# Patient Record
Sex: Female | Born: 1965 | Race: Black or African American | Hispanic: No | Marital: Married | Smoking: Never smoker
Health system: Southern US, Community
[De-identification: ages and names within clinical notes are randomized; demographics above are authoritative.]

## PROBLEM LIST (undated history)

## (undated) DIAGNOSIS — E119 Type 2 diabetes mellitus without complications: Secondary | ICD-10-CM

## (undated) DIAGNOSIS — I10 Essential (primary) hypertension: Secondary | ICD-10-CM

## (undated) DIAGNOSIS — K5792 Diverticulitis of intestine, part unspecified, without perforation or abscess without bleeding: Secondary | ICD-10-CM

## (undated) DIAGNOSIS — K219 Gastro-esophageal reflux disease without esophagitis: Secondary | ICD-10-CM

## (undated) DIAGNOSIS — R935 Abnormal findings on diagnostic imaging of other abdominal regions, including retroperitoneum: Secondary | ICD-10-CM

## (undated) DIAGNOSIS — K5790 Diverticulosis of intestine, part unspecified, without perforation or abscess without bleeding: Secondary | ICD-10-CM

## (undated) HISTORY — DX: Type 2 diabetes mellitus without complications: E11.9

## (undated) HISTORY — PX: ABDOMINAL HYSTERECTOMY: SHX81

---

## 2014-10-26 ENCOUNTER — Observation Stay (HOSPITAL_COMMUNITY)
Admission: EM | Admit: 2014-10-26 | Discharge: 2014-10-27 | Disposition: A | Discharge status: Home / Self Care | Payer: Managed Care, Other (non HMO) | Attending: Emergency Medicine | Admitting: Emergency Medicine

## 2014-10-26 ENCOUNTER — Encounter (HOSPITAL_COMMUNITY): Payer: Self-pay | Admitting: Emergency Medicine

## 2014-10-26 ENCOUNTER — Emergency Department (HOSPITAL_COMMUNITY): Payer: Managed Care, Other (non HMO)

## 2014-10-26 DIAGNOSIS — I10 Essential (primary) hypertension: Secondary | ICD-10-CM | POA: Diagnosis not present

## 2014-10-26 DIAGNOSIS — R1084 Generalized abdominal pain: Secondary | ICD-10-CM | POA: Diagnosis not present

## 2014-10-26 DIAGNOSIS — K5792 Diverticulitis of intestine, part unspecified, without perforation or abscess without bleeding: Secondary | ICD-10-CM | POA: Diagnosis present

## 2014-10-26 DIAGNOSIS — R112 Nausea with vomiting, unspecified: Secondary | ICD-10-CM

## 2014-10-26 DIAGNOSIS — R1033 Periumbilical pain: Secondary | ICD-10-CM | POA: Insufficient documentation

## 2014-10-26 DIAGNOSIS — K219 Gastro-esophageal reflux disease without esophagitis: Secondary | ICD-10-CM | POA: Diagnosis not present

## 2014-10-26 DIAGNOSIS — R109 Unspecified abdominal pain: Secondary | ICD-10-CM | POA: Diagnosis present

## 2014-10-26 DIAGNOSIS — R935 Abnormal findings on diagnostic imaging of other abdominal regions, including retroperitoneum: Secondary | ICD-10-CM | POA: Diagnosis present

## 2014-10-26 HISTORY — DX: Abnormal findings on diagnostic imaging of other abdominal regions, including retroperitoneum: R93.5

## 2014-10-26 HISTORY — DX: Gastro-esophageal reflux disease without esophagitis: K21.9

## 2014-10-26 HISTORY — DX: Diverticulitis of intestine, part unspecified, without perforation or abscess without bleeding: K57.92

## 2014-10-26 HISTORY — DX: Essential (primary) hypertension: I10

## 2014-10-26 HISTORY — DX: Diverticulosis of intestine, part unspecified, without perforation or abscess without bleeding: K57.90

## 2014-10-26 LAB — COMPREHENSIVE METABOLIC PANEL
ALBUMIN: 4 g/dL (ref 3.5–5.2)
ALK PHOS: 46 U/L (ref 39–117)
ALT: 23 U/L (ref 0–35)
ANION GAP: 8 (ref 5–15)
AST: 25 U/L (ref 0–37)
BILIRUBIN TOTAL: 1.3 mg/dL — AB (ref 0.3–1.2)
BUN: 12 mg/dL (ref 6–23)
CALCIUM: 9.5 mg/dL (ref 8.4–10.5)
CO2: 29 mmol/L (ref 19–32)
Chloride: 101 mmol/L (ref 96–112)
Creatinine, Ser: 0.89 mg/dL (ref 0.50–1.10)
GFR calc Af Amer: 87 mL/min — ABNORMAL LOW (ref >90)
GFR calc non Af Amer: 75 mL/min — ABNORMAL LOW (ref >90)
GLUCOSE: 114 mg/dL — AB (ref 70–99)
POTASSIUM: 3.1 mmol/L — AB (ref 3.5–5.1)
Sodium: 138 mmol/L (ref 135–145)
Total Protein: 7.2 g/dL (ref 6.0–8.3)

## 2014-10-26 LAB — URINALYSIS, ROUTINE W REFLEX MICROSCOPIC
Bilirubin Urine: NEGATIVE
GLUCOSE, UA: NEGATIVE mg/dL
Ketones, ur: NEGATIVE mg/dL
Nitrite: NEGATIVE
Protein, ur: NEGATIVE mg/dL
SPECIFIC GRAVITY, URINE: 1.01 (ref 1.005–1.030)
UROBILINOGEN UA: 0.2 mg/dL (ref 0.0–1.0)
pH: 7 (ref 5.0–8.0)

## 2014-10-26 LAB — URINE MICROSCOPIC-ADD ON

## 2014-10-26 LAB — CBC WITH DIFFERENTIAL/PLATELET
Basophils Absolute: 0 10*3/uL (ref 0.0–0.1)
Basophils Relative: 0 % (ref 0–1)
EOS ABS: 0 10*3/uL (ref 0.0–0.7)
Eosinophils Relative: 0 % (ref 0–5)
HCT: 36.8 % (ref 36.0–46.0)
HEMOGLOBIN: 12.3 g/dL (ref 12.0–15.0)
LYMPHS ABS: 2.9 10*3/uL (ref 0.7–4.0)
Lymphocytes Relative: 16 % (ref 12–46)
MCH: 31.5 pg (ref 26.0–34.0)
MCHC: 33.4 g/dL (ref 30.0–36.0)
MCV: 94.4 fL (ref 78.0–100.0)
MONOS PCT: 4 % (ref 3–12)
Monocytes Absolute: 0.7 10*3/uL (ref 0.1–1.0)
NEUTROS ABS: 14.4 10*3/uL — AB (ref 1.7–7.7)
Neutrophils Relative %: 80 % — ABNORMAL HIGH (ref 43–77)
PLATELETS: 275 10*3/uL (ref 150–400)
RBC: 3.9 MIL/uL (ref 3.87–5.11)
RDW: 12.8 % (ref 11.5–15.5)
WBC: 18 10*3/uL — ABNORMAL HIGH (ref 4.0–10.5)

## 2014-10-26 LAB — LIPASE, BLOOD: Lipase: 17 U/L (ref 11–59)

## 2014-10-26 LAB — MAGNESIUM: Magnesium: 1.9 mg/dL (ref 1.5–2.5)

## 2014-10-26 MED ORDER — IOHEXOL 300 MG/ML  SOLN
100.0000 mL | Freq: Once | INTRAMUSCULAR | Status: AC | PRN
  Administered 2014-10-26: 100 mL via INTRAVENOUS

## 2014-10-26 MED ORDER — HEPARIN SODIUM (PORCINE) 5000 UNIT/ML IJ SOLN
5000.0000 [IU] | Freq: Three times a day (TID) | INTRAMUSCULAR | Status: DC
Start: 2014-10-26 — End: 2014-10-27
  Administered 2014-10-27 (×2): 5000 [IU] via SUBCUTANEOUS
  Filled 2014-10-26 (×2): qty 1

## 2014-10-26 MED ORDER — MORPHINE SULFATE 4 MG/ML IJ SOLN
4.0000 mg | Freq: Once | INTRAMUSCULAR | Status: AC
  Administered 2014-10-26: 4 mg via INTRAVENOUS
  Filled 2014-10-26: qty 1

## 2014-10-26 MED ORDER — METRONIDAZOLE IN NACL 5-0.79 MG/ML-% IV SOLN
500.0000 mg | Freq: Once | INTRAVENOUS | Status: AC
  Administered 2014-10-26: 500 mg via INTRAVENOUS
  Filled 2014-10-26: qty 100

## 2014-10-26 MED ORDER — POTASSIUM CHLORIDE IN NACL 20-0.9 MEQ/L-% IV SOLN
INTRAVENOUS | Status: DC
  Administered 2014-10-26 – 2014-10-27 (×2): 1 mL via INTRAVENOUS

## 2014-10-26 MED ORDER — POTASSIUM CHLORIDE CRYS ER 20 MEQ PO TBCR
40.0000 meq | EXTENDED_RELEASE_TABLET | Freq: Once | ORAL | Status: AC
  Administered 2014-10-26: 40 meq via ORAL
  Filled 2014-10-26: qty 2

## 2014-10-26 MED ORDER — MORPHINE SULFATE 2 MG/ML IJ SOLN: 2.0000 mg | INTRAMUSCULAR | Status: DC | PRN

## 2014-10-26 MED ORDER — CIPROFLOXACIN IN D5W 400 MG/200ML IV SOLN
400.0000 mg | Freq: Two times a day (BID) | INTRAVENOUS | Status: DC
  Administered 2014-10-27: 400 mg via INTRAVENOUS
  Filled 2014-10-26: qty 200

## 2014-10-26 MED ORDER — ONDANSETRON HCL 4 MG/2ML IJ SOLN: 4.0000 mg | Freq: Four times a day (QID) | INTRAMUSCULAR | Status: DC | PRN

## 2014-10-26 MED ORDER — SODIUM CHLORIDE 0.9 % IV BOLUS (SEPSIS)
1000.0000 mL | Freq: Once | INTRAVENOUS | Status: AC
  Administered 2014-10-26: 1000 mL via INTRAVENOUS

## 2014-10-26 MED ORDER — IOHEXOL 300 MG/ML  SOLN
25.0000 mL | Freq: Once | INTRAMUSCULAR | Status: AC | PRN
  Administered 2014-10-26: 25 mL via ORAL

## 2014-10-26 MED ORDER — CIPROFLOXACIN IN D5W 400 MG/200ML IV SOLN
400.0000 mg | Freq: Once | INTRAVENOUS | Status: AC
Start: 2014-10-26 — End: 2014-10-26
  Administered 2014-10-26: 400 mg via INTRAVENOUS
  Filled 2014-10-26: qty 200

## 2014-10-26 MED ORDER — ONDANSETRON HCL 4 MG PO TABS: 4.0000 mg | ORAL_TABLET | Freq: Four times a day (QID) | ORAL | Status: DC | PRN

## 2014-10-26 MED ORDER — METRONIDAZOLE IN NACL 5-0.79 MG/ML-% IV SOLN
500.0000 mg | Freq: Three times a day (TID) | INTRAVENOUS | Status: DC
  Administered 2014-10-27 (×2): 500 mg via INTRAVENOUS
  Filled 2014-10-26 (×2): qty 100

## 2014-10-26 MED ORDER — ONDANSETRON HCL 4 MG/2ML IJ SOLN
4.0000 mg | Freq: Once | INTRAMUSCULAR | Status: AC
  Administered 2014-10-26: 4 mg via INTRAVENOUS
  Filled 2014-10-26: qty 2

## 2014-10-26 NOTE — ED Provider Notes (Signed)
This chart was scribed for [place], DO by Peyton Bottoms, ED Scribe. This patient was seen in room APA12/APA12 and the patient's care was started at 1:36 PM.  TIME SEEN: 1:37 PM   CHIEF COMPLAINT: Abdominal Pain  HPI: Beth Freeman is a 49 y.o. female who presents to the Emergency Department complaining of moderate periumbilical abdominal pain with associated nausea, and vomiting that began last night after patient ate dinner last night. She states that her last normal bowel movement was earlier today. She denies associated diarrhea, dysuria, hematuria, vaginal bleeding, vaginal discharge. She has had a hysterectomy. She denies recent travel or sick contacts.  ROS: See HPI Constitutional: no fever  Eyes: no drainage  ENT: no runny nose   Cardiovascular:  no chest pain  Resp: no SOB  GI: nausea; vomiting; periumbilical abdominal pain; no diarrhea GU: no dysuria; no hematuria; no vaginal bleeding; no vaginal discharge Integumentary: no rash  Allergy: no hives  Musculoskeletal: no leg swelling  Neurological: no slurred speech ROS otherwise negative  PAST MEDICAL HISTORY/PAST SURGICAL HISTORY:  Past Medical History  Diagnosis Date  . GERD (gastroesophageal reflux disease)   . Hypertension     MEDICATIONS:  Prior to Admission medications   Not on File    ALLERGIES:  No Known Allergies  SOCIAL HISTORY:  History  Substance Use Topics  . Smoking status: Never Smoker   . Smokeless tobacco: Not on file  . Alcohol Use: No    FAMILY HISTORY: No family history on file.  EXAM: Triage Vitals: BP 137/65 mmHg  Pulse 99  Temp(Src) 98.3 F (36.8 C) (Oral)  Resp 18  Ht 5\' 4"  (1.626 m)  Wt 180 lb (81.647 kg)  BMI 30.88 kg/m2  SpO2 100%  CONSTITUTIONAL: Alert and oriented and responds appropriately to questions. Well-appearing; well-nourished HEAD: Normocephalic EYES: Conjunctivae clear, PERRL ENT: normal nose; no rhinorrhea; moist mucous membranes; pharynx without  lesions noted NECK: Supple, no meningismus, no LAD  CARD: RRR; S1 and S2 appreciated; no murmurs, no clicks, no rubs, no gallops RESP: Normal chest excursion without splinting or tachypnea; breath sounds clear and equal bilaterally; no wheezes, no rhonchi, no rales,  ABD/GI: Normal bowel sounds; diffuse abdominal tenderness with guarding, no rebound, no peritoneal signs BACK:  The back appears normal and is non-tender to palpation, there is no CVA tenderness EXT: Normal ROM in all joints; non-tender to palpation; no edema; normal capillary refill; no cyanosis    SKIN: Normal color for age and race; warm NEURO: Moves all extremities equally PSYCH: The patient's mood and manner are appropriate. Grooming and personal hygiene are appropriate.  MEDICAL DECISION MAKING: Patient here with abdominal pain, vomiting. She is diffusely tender with voluntary guarding on exam. Labs show leukocytosis with left shift. Potassium slightly low at 3.1. Will replace.  Urine shows trace hemoglobin, leukocytes and bacteria but also squamous cells. Patient does not have urinary symptoms. I do not feel this needs to be treated at this time. Culture is pending. We'll obtain CT of her abdomen and pelvis for further evaluation. Will treat with IV fluids, morphine, Zofran.  ED PROGRESS: CT scan pending. Signed out to Dr. Roderic Palau to follow up on imaging.    I personally performed the services described in this documentation, which was scribed in my presence. The recorded information has been reviewed and is accurate.   [place], DO 10/26/14 [phone]

## 2014-10-26 NOTE — H&P (Signed)
Hospitalist Admission History and Physical  Patient name: Beth Freeman Medical record number: [id number] Date of birth: [birth year] Age: 49 y.o. Gender: female  Primary Care Provider: [name], MD  Chief Complaint: diverticulitis   History of Present Illness:This is a 49 y.o. year old female with significant past medical history of HTN, diverticulosis presenting with divericulitis. Pt reports lower abd pain, nausea, vomiting over the past 2 days. Several episodes of NBNB vomiting.no diarrhea. No fevers. Pt reports eating whole pack of nuts yesterday. Reports hx/o diverticulitis in the past. Knows to avoid nuts.  Presents to the ER afebrile, hemodynamically stable. WBC 18. K 3.1. CT shows Suspected mild diverticulitis just distal to the splenic flexure of the colon. No evidence of bowel obstruction or abscess. ? Indeterminate R adnexal lesion.    Assessment and Plan: Beth Freeman is a 49 y.o. year old female presenting with diverticulitis    Active Problems:   Diverticulitis   1- Diverticulitis  -IV cipro and flagyl  -NPO  -antiemetics -pain control   2- Hypokalemia -replete  -mag level  -hold diuretic-follow  3- HTN -BP stable  -hold BP meds   4-? Adnexal lesion  -follow -may need inpt vs. outpt imaging   FEN/GI: NPO.  Prophylaxis: sub q heparin  Disposition: pending further evaluation  Code Status:Full Code    Patient Active Problem List   Diagnosis Date Noted  . Diverticulitis 10/26/2014   Past Medical History: Past Medical History  Diagnosis Date  . GERD (gastroesophageal reflux disease)   . Hypertension     Past Surgical History: Past Surgical History  Procedure Laterality Date  . Abdominal hysterectomy      Social History: History   Social History  . Marital Status: Married    Spouse Name: N/A  . Number of Children: N/A  . Years of Education: N/A   Social History Main Topics  . Smoking status: Never Smoker   . Smokeless tobacco:  Not on file  . Alcohol Use: No  . Drug Use: No  . Sexual Activity: Not on file   Other Topics Concern  . None   Social History Narrative  . None    Family History: No family history on file.  Allergies: No Known Allergies  Current Facility-Administered Medications  Medication Dose Route Frequency Provider Last Rate Last Dose  . 0.9 % NaCl with KCl 20 mEq/ L  infusion   Intravenous Continuous Shanda Howells, MD      . ciprofloxacin (CIPRO) IVPB 400 mg  400 mg Intravenous Once Maudry Diego, MD      . ciprofloxacin (CIPRO) IVPB 400 mg  400 mg Intravenous Q12H Shanda Howells, MD      . heparin injection 5,000 Units  5,000 Units Subcutaneous 3 times per day Shanda Howells, MD      . metroNIDAZOLE (FLAGYL) IVPB 500 mg  500 mg Intravenous Once Maudry Diego, MD 100 mL/hr at 10/26/14 1707 500 mg at 10/26/14 1707  . metroNIDAZOLE (FLAGYL) IVPB 500 mg  500 mg Intravenous Q8H Shanda Howells, MD      . morphine 2 MG/ML injection 2-4 mg  2-4 mg Intravenous Q3H PRN Shanda Howells, MD      . ondansetron [facility]) tablet 4 mg  4 mg Oral Q6H PRN Shanda Howells, MD       Or  . ondansetron [facility]) injection 4 mg  4 mg Intravenous Q6H PRN Shanda Howells, MD       Current Outpatient Prescriptions  Medication Sig  Dispense Refill  . losartan-hydrochlorothiazide (HYZAAR) 100-25 MG per tablet Take 1 tablet by mouth daily.     Review Of Systems: 12 point ROS negative except as noted above in HPI.  Physical Exam: Filed Vitals:   10/26/14 1700  BP:   Pulse: 83  Temp:   Resp:     General: alert and cooperative HEENT: PERRLA and extra ocular movement intact Heart: S1, S2 normal, no murmur, rub or gallop, regular rate and rhythm Lungs: clear to auscultation, no wheezes or rales and unlabored breathing Abdomen: + bowel sounds, + generalized abd pain  Extremities: extremities normal, atraumatic, no cyanosis or edema Skin:no rashes Neurology: normal without focal findings  Labs and Imaging: Lab  Results  Component Value Date/Time   NA 138 10/26/2014 01:59 PM   K 3.1* 10/26/2014 01:59 PM   CL 101 10/26/2014 01:59 PM   CO2 29 10/26/2014 01:59 PM   BUN 12 10/26/2014 01:59 PM   CREATININE 0.89 10/26/2014 01:59 PM   GLUCOSE 114* 10/26/2014 01:59 PM   Lab Results  Component Value Date   WBC 18.0* 10/26/2014   HGB 12.3 10/26/2014   HCT 36.8 10/26/2014   MCV 94.4 10/26/2014   PLT 275 10/26/2014    Ct Abdomen Pelvis W Contrast  10/26/2014   CLINICAL DATA:  Periumbilical pain with nausea and vomiting since last night. History of hypertension, hysterectomy and gastroesophageal reflux disease. Initial encounter.  EXAM: CT ABDOMEN AND PELVIS WITH CONTRAST  TECHNIQUE: Multidetector CT imaging of the abdomen and pelvis was performed using the standard protocol following bolus administration of intravenous contrast.  CONTRAST:  135mL OMNIPAQUE IOHEXOL 300 MG/ML SOLN, 49mL OMNIPAQUE IOHEXOL 300 MG/ML SOLN  COMPARISON:  None.  FINDINGS: Images through the upper abdomen are degraded by motion despite repeating some of these images.  Lower chest: Mild dependent atelectasis at both lung bases. No significant pleural or pericardial effusion. Small hiatal hernia.  Hepatobiliary: The liver is normal in density without focal abnormality. No evidence of gallstones, gallbladder wall thickening or biliary dilatation.  Pancreas: Unremarkable. No pancreatic ductal dilatation or surrounding inflammatory changes.  Spleen: The spleen is small without focal abnormality or surrounding inflammatory change.  Adrenals/Urinary Tract: Both adrenal glands appear normal.The kidneys appear normal without evidence of urinary tract calculus, suspicious lesion or hydronephrosis. No bladder abnormalities are seen.  Stomach/Bowel: Diverticular changes throughout the distal colon. There is suspected mild wall thickening of the proximal descending colon with mild surrounding inflammatory change. There is no focal extraluminal fluid  collection. There is no evidence of small bowel distension or wall thickening.The appendix appears normal.  Vascular/Lymphatic: There are no enlarged abdominal or pelvic lymph nodes. No significant vascular findings are present.  Reproductive: Status post partial hysterectomy. There is a 4.6 mm right adnexal mass on image 68 which measures soft tissue density. Within the left adnexa, there is a 3.3 cm soft tissue structure on image 67 which is probably a normal ovary.  Other: No evidence of abdominal wall mass or hernia.  Musculoskeletal: No acute or significant osseous findings.  IMPRESSION: 1. Suspected mild diverticulitis just distal to the splenic flexure of the colon. No evidence of bowel obstruction or abscess. 2. No evidence of appendicitis. 3. Indeterminate right adnexal lesion status post apparent partial hysterectomy. This could reflect a hemorrhagic ovarian cyst. Consider ultrasound correlation or follow-up. 4. Bibasilar atelectasis.   Electronically Signed   By: Richardean Sale M.D.   On: 10/26/2014 16:44  Shanda Howells MD  Pager: [phone]

## 2014-10-26 NOTE — ED Notes (Signed)
Assisted pt to restroom  

## 2014-10-26 NOTE — ED Notes (Signed)
PT stated she starting having periumbilical abdominal pain with n/v last night. Pt stated vomiting x6. PT denies any diarrhea and last BM today.

## 2014-10-27 ENCOUNTER — Encounter (HOSPITAL_COMMUNITY): Payer: Self-pay | Admitting: Internal Medicine

## 2014-10-27 DIAGNOSIS — I10 Essential (primary) hypertension: Secondary | ICD-10-CM

## 2014-10-27 DIAGNOSIS — K219 Gastro-esophageal reflux disease without esophagitis: Secondary | ICD-10-CM

## 2014-10-27 DIAGNOSIS — K5792 Diverticulitis of intestine, part unspecified, without perforation or abscess without bleeding: Secondary | ICD-10-CM

## 2014-10-27 DIAGNOSIS — R109 Unspecified abdominal pain: Secondary | ICD-10-CM

## 2014-10-27 DIAGNOSIS — R935 Abnormal findings on diagnostic imaging of other abdominal regions, including retroperitoneum: Secondary | ICD-10-CM | POA: Diagnosis present

## 2014-10-27 LAB — CBC WITH DIFFERENTIAL/PLATELET
BASOS PCT: 0 % (ref 0–1)
Basophils Absolute: 0 10*3/uL (ref 0.0–0.1)
Eosinophils Absolute: 0 10*3/uL (ref 0.0–0.7)
Eosinophils Relative: 0 % (ref 0–5)
HEMATOCRIT: 32 % — AB (ref 36.0–46.0)
Hemoglobin: 10.5 g/dL — ABNORMAL LOW (ref 12.0–15.0)
LYMPHS PCT: 30 % (ref 12–46)
Lymphs Abs: 3.4 10*3/uL (ref 0.7–4.0)
MCH: 31.3 pg (ref 26.0–34.0)
MCHC: 32.8 g/dL (ref 30.0–36.0)
MCV: 95.2 fL (ref 78.0–100.0)
Monocytes Absolute: 0.7 10*3/uL (ref 0.1–1.0)
Monocytes Relative: 6 % (ref 3–12)
Neutro Abs: 7.1 10*3/uL (ref 1.7–7.7)
Neutrophils Relative %: 64 % (ref 43–77)
PLATELETS: 254 10*3/uL (ref 150–400)
RBC: 3.36 MIL/uL — ABNORMAL LOW (ref 3.87–5.11)
RDW: 12.8 % (ref 11.5–15.5)
WBC: 11.3 10*3/uL — AB (ref 4.0–10.5)

## 2014-10-27 LAB — COMPREHENSIVE METABOLIC PANEL
ALBUMIN: 3 g/dL — AB (ref 3.5–5.2)
ALK PHOS: 46 U/L (ref 39–117)
ALT: 19 U/L (ref 0–35)
AST: 20 U/L (ref 0–37)
Anion gap: 4 — ABNORMAL LOW (ref 5–15)
BUN: 9 mg/dL (ref 6–23)
CO2: 28 mmol/L (ref 19–32)
Calcium: 8.5 mg/dL (ref 8.4–10.5)
Chloride: 108 mmol/L (ref 96–112)
Creatinine, Ser: 0.86 mg/dL (ref 0.50–1.10)
GFR calc Af Amer: >90 mL/min (ref >90)
GFR calc non Af Amer: 78 mL/min — ABNORMAL LOW (ref >90)
GLUCOSE: 119 mg/dL — AB (ref 70–99)
Potassium: 3.7 mmol/L (ref 3.5–5.1)
Sodium: 140 mmol/L (ref 135–145)
Total Bilirubin: 1.4 mg/dL — ABNORMAL HIGH (ref 0.3–1.2)
Total Protein: 5.8 g/dL — ABNORMAL LOW (ref 6.0–8.3)

## 2014-10-27 LAB — URINE CULTURE: Colony Count: 60000

## 2014-10-27 MED ORDER — CIPROFLOXACIN HCL 500 MG PO TABS: 500.0000 mg | ORAL_TABLET | Freq: Two times a day (BID) | ORAL | Status: DC

## 2014-10-27 MED ORDER — HYDROCODONE-ACETAMINOPHEN 5-325 MG PO TABS: 1.0000 | ORAL_TABLET | Freq: Four times a day (QID) | ORAL | Status: DC | PRN

## 2014-10-27 MED ORDER — METRONIDAZOLE 500 MG PO TABS: 500.0000 mg | ORAL_TABLET | Freq: Three times a day (TID) | ORAL | Status: DC

## 2014-10-27 NOTE — Progress Notes (Signed)
Discharge instructions and prescriptions given, verbalized understanding, out in stable condition ambulatory with staff. 

## 2014-10-27 NOTE — Discharge Summary (Signed)
Physician Discharge Summary  Beth Freeman ATF:[id number] DOB: 1966/06/16 DOA: 10/26/2014  PCP: [name], MD  Admit date: 10/26/2014 Discharge date: 10/27/2014  Time spent: 40 minutes  Recommendations for Outpatient Follow-up:  1. PCP 1-2 weeks for evaluation of diverticulitis. Follow up on ? Adnexal lesion   Discharge Diagnoses:  Principal Problem:   Diverticulitis Active Problems:   Hypertension   GERD (gastroesophageal reflux disease)   Abdominal pain   Discharge Condition: stable  Diet recommendation: soft advance as tolerated  Filed Weights   10/26/14 1323 10/26/14 1852  Weight: 81.647 kg (180 lb) 79.6 kg (175 lb 7.8 oz)    History of present illness:  This is a 49 y.o. year old female with significant past medical history of HTN, diverticulosis presented to ED on 10/26/14 with divericulitis. Pt reported lower abd pain, nausea, vomiting over 2 days. Several episodes of NBNB vomiting.no diarrhea. No fevers. Pt reported eating whole pack of nuts 10/25/14. Reported hx/o diverticulitis in the past. Knows to avoid nuts.  Presented to the ER afebrile, hemodynamically stable. WBC 18. K 3.1. CT showed Suspected mild diverticulitis just distal to the splenic flexure of the colon. No evidence of bowel obstruction or abscess. ? Indeterminate R adnexal lesion  Hospital Course:  1- Diverticulitis  - amitted and provided with IV cipro and flagyl and bowel rest. Abdominal pain resolved. No n/v. Tolerating soft bland diet at discharge. Instructed to advance as tolerated. Will discharge with cipro and flagyl    2- Hypokalemia -repleted and resolved at discharge. Magnesium within the limits of normal. Home HCTZ held  3- HTN -BP remained stable.   4-? Adnexal lesion  -follow OP. Will likely need further imaging.    Procedures:  none  Consultations:  none  Discharge Exam: Filed Vitals:   10/27/14 0635  BP: 126/72  Pulse: 82  Temp: 98.4 F (36.9 C)  Resp: 16     General: ambulating in room gait steady Cardiovascular: RRR no MGR No LE edema Respiratory: normal effort BS clear bilaterally no wheeze Abdomen: non-distended +BS only mild diffuse tenderness  Discharge Instructions    Current Discharge Medication List    START taking these medications   Details  ciprofloxacin (CIPRO) 500 MG tablet Take 1 tablet (500 mg total) by mouth 2 (two) times daily. Qty: 14 tablet, Refills: 0    metroNIDAZOLE (FLAGYL) 500 MG tablet Take 1 tablet (500 mg total) by mouth 3 (three) times daily. Qty: 21 tablet, Refills: 0      CONTINUE these medications which have NOT CHANGED   Details  losartan-hydrochlorothiazide (HYZAAR) 100-25 MG per tablet Take 1 tablet by mouth daily.       No Known Allergies    The results of significant diagnostics from this hospitalization (including imaging, microbiology, ancillary and laboratory) are listed below for reference.    Significant Diagnostic Studies: Ct Abdomen Pelvis W Contrast  10/26/2014   CLINICAL DATA:  Periumbilical pain with nausea and vomiting since last night. History of hypertension, hysterectomy and gastroesophageal reflux disease. Initial encounter.  EXAM: CT ABDOMEN AND PELVIS WITH CONTRAST  TECHNIQUE: Multidetector CT imaging of the abdomen and pelvis was performed using the standard protocol following bolus administration of intravenous contrast.  CONTRAST:  132mL OMNIPAQUE IOHEXOL 300 MG/ML SOLN, 41mL OMNIPAQUE IOHEXOL 300 MG/ML SOLN  COMPARISON:  None.  FINDINGS: Images through the upper abdomen are degraded by motion despite repeating some of these images.  Lower chest: Mild dependent atelectasis at both lung bases. No significant pleural  or pericardial effusion. Small hiatal hernia.  Hepatobiliary: The liver is normal in density without focal abnormality. No evidence of gallstones, gallbladder wall thickening or biliary dilatation.  Pancreas: Unremarkable. No pancreatic ductal dilatation or  surrounding inflammatory changes.  Spleen: The spleen is small without focal abnormality or surrounding inflammatory change.  Adrenals/Urinary Tract: Both adrenal glands appear normal.The kidneys appear normal without evidence of urinary tract calculus, suspicious lesion or hydronephrosis. No bladder abnormalities are seen.  Stomach/Bowel: Diverticular changes throughout the distal colon. There is suspected mild wall thickening of the proximal descending colon with mild surrounding inflammatory change. There is no focal extraluminal fluid collection. There is no evidence of small bowel distension or wall thickening.The appendix appears normal.  Vascular/Lymphatic: There are no enlarged abdominal or pelvic lymph nodes. No significant vascular findings are present.  Reproductive: Status post partial hysterectomy. There is a 4.6 mm right adnexal mass on image 68 which measures soft tissue density. Within the left adnexa, there is a 3.3 cm soft tissue structure on image 67 which is probably a normal ovary.  Other: No evidence of abdominal wall mass or hernia.  Musculoskeletal: No acute or significant osseous findings.  IMPRESSION: 1. Suspected mild diverticulitis just distal to the splenic flexure of the colon. No evidence of bowel obstruction or abscess. 2. No evidence of appendicitis. 3. Indeterminate right adnexal lesion status post apparent partial hysterectomy. This could reflect a hemorrhagic ovarian cyst. Consider ultrasound correlation or follow-up. 4. Bibasilar atelectasis.   Electronically Signed   By: Richardean Sale M.D.   On: 10/26/2014 16:44    Microbiology: No results found for this or any previous visit (from the past 240 hour(s)).   Labs: Basic Metabolic Panel:  Recent Labs Lab 10/26/14 1359 10/27/14 0445  NA 138 140  K 3.1* 3.7  CL 101 108  CO2 29 28  GLUCOSE 114* 119*  BUN 12 9  CREATININE 0.89 0.86  CALCIUM 9.5 8.5  MG 1.9  --    Liver Function Tests:  Recent Labs Lab  10/26/14 1359 10/27/14 0445  AST 25 20  ALT 23 19  ALKPHOS 46 46  BILITOT 1.3* 1.4*  PROT 7.2 5.8*  ALBUMIN 4.0 3.0*    Recent Labs Lab 10/26/14 1359  LIPASE 17   No results for input(s): AMMONIA in the last 168 hours. CBC:  Recent Labs Lab 10/26/14 1359 10/27/14 0445  WBC 18.0* 11.3*  NEUTROABS 14.4* 7.1  HGB 12.3 10.5*  HCT 36.8 32.0*  MCV 94.4 95.2  PLT 275 254   Cardiac Enzymes: No results for input(s): CKTOTAL, CKMB, CKMBINDEX, TROPONINI in the last 168 hours. BNP: BNP (last 3 results) No results for input(s): BNP in the last 8760 hours.  ProBNP (last 3 results) No results for input(s): PROBNP in the last 8760 hours.  CBG: No results for input(s): GLUCAP in the last 168 hours.     SignedRadene Gunning  [facility] 10/27/2014, 12:33 PM

## 2014-10-27 NOTE — Progress Notes (Signed)
UR completed 

## 2014-10-27 NOTE — Care Management Note (Signed)
    Page 1 of 1   10/27/2014     12:43:56 PM CARE MANAGEMENT NOTE 10/27/2014  Patient:  Beth Freeman, Beth Freeman   Account Number:  [id number]  Date Initiated:  10/27/2014  Documentation initiated by:  Jolene Provost  Subjective/Objective Assessment:   Pt is from home, independent at baseline. Pt admitted with diverticulitis. Pt discharging home with self care. No CM needs identified.     Action/Plan:   Anticipated DC Date:  10/27/2014   Anticipated DC Plan:  [place]  CM consult      Choice offered to / List presented to:             Status of service:  Completed, signed off Medicare Important Message given?   (If response is "NO", the following Medicare IM given date fields will be blank) Date Medicare IM given:   Medicare IM given by:   Date Additional Medicare IM given:   Additional Medicare IM given by:    Discharge Disposition:  HOME/SELF CARE  Per UR Regulation:    If discussed at Long Length of Stay Meetings, dates discussed:    Comments:  10/27/2014 [place], RN, MSN, CM

## 2016-08-12 IMAGING — CT CT ABD-PELV W/ CM
2 of 6 series · 15 of 46 positions shown, 17 images · IV contrast (omnipaque)
Comparison: None.

CLINICAL DATA: Periumbilical pain with nausea and vomiting since
last night. History of hypertension, hysterectomy and
gastroesophageal reflux disease. Initial encounter.

EXAM:
CT ABDOMEN AND PELVIS WITH CONTRAST
TECHNIQUE: Multidetector CT imaging of the abdomen and pelvis was performed
using the standard protocol following bolus administration of
intravenous contrast.
CONTRAST:  100mL OMNIPAQUE IOHEXOL 300 MG/ML SOLN, 25mL OMNIPAQUE
IOHEXOL 300 MG/ML SOLN

[Series 2: abd_pel_with 5.0 b40f · axial · 0.77mm/px · z∈[-407,-2]mm · 12 of 93 slices shown, 14 images]
[im 6/93  soft-tissue]
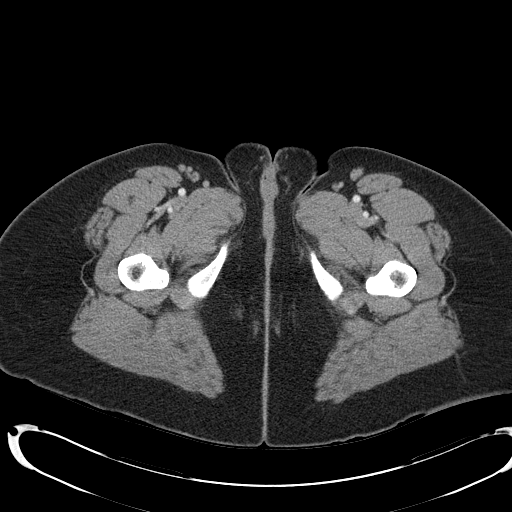
[im 6/93  bone]
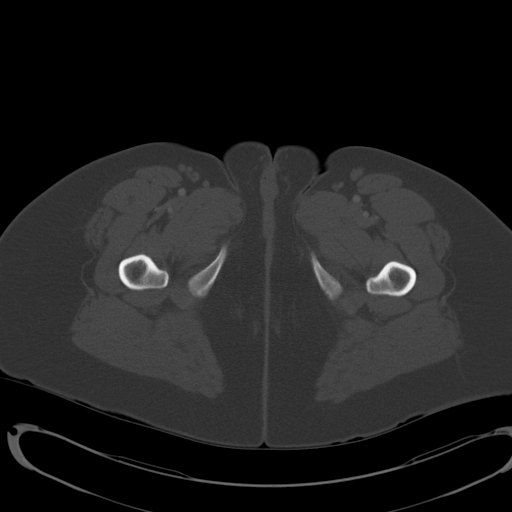
[im 12/93  soft-tissue]
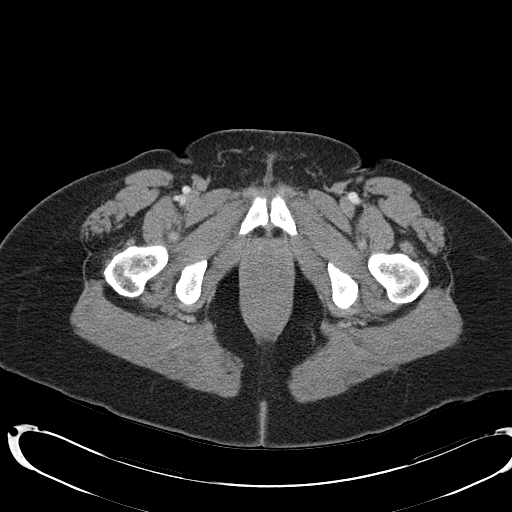
[im 24/93  soft-tissue]
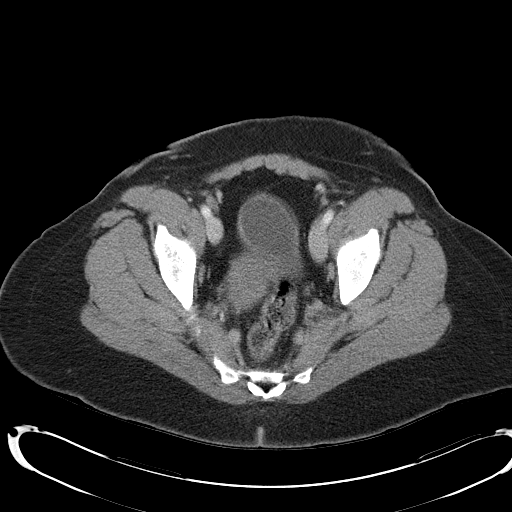
[im 29/93  soft-tissue]
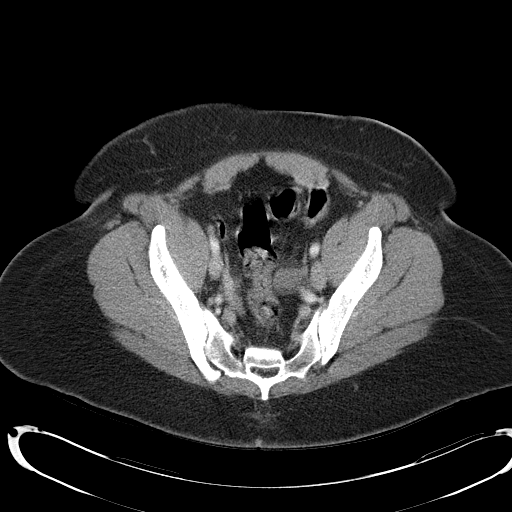
[im 35/93  soft-tissue]
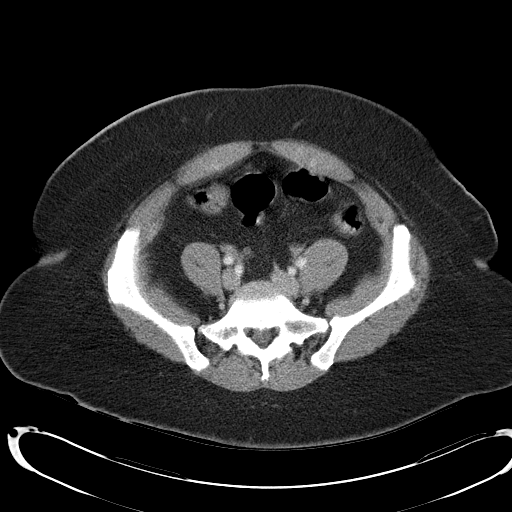
[im 41/93  soft-tissue]
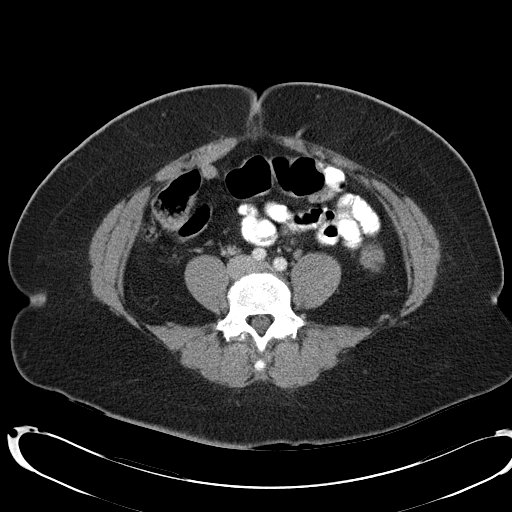
[im 52/93  soft-tissue]
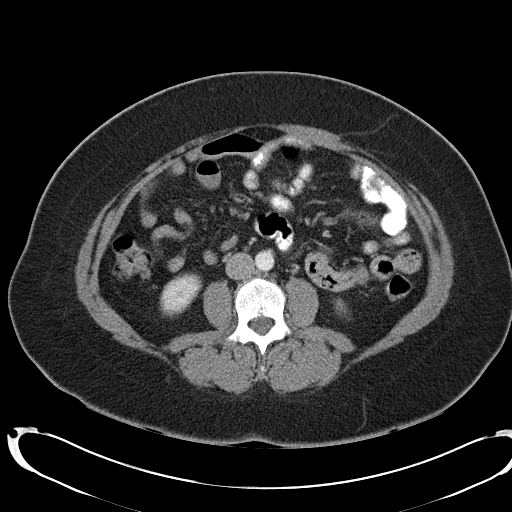
[im 58/93  soft-tissue]
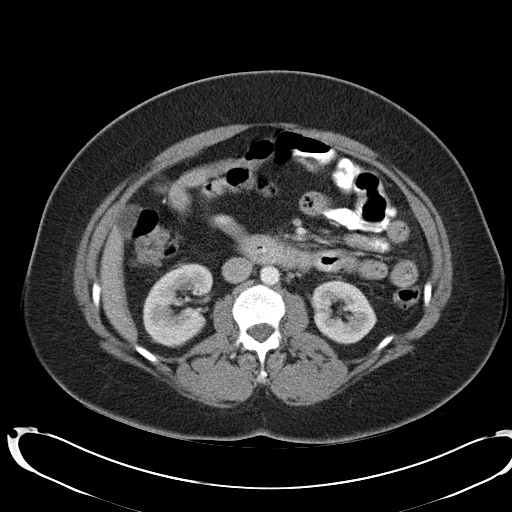
[im 64/93  soft-tissue]
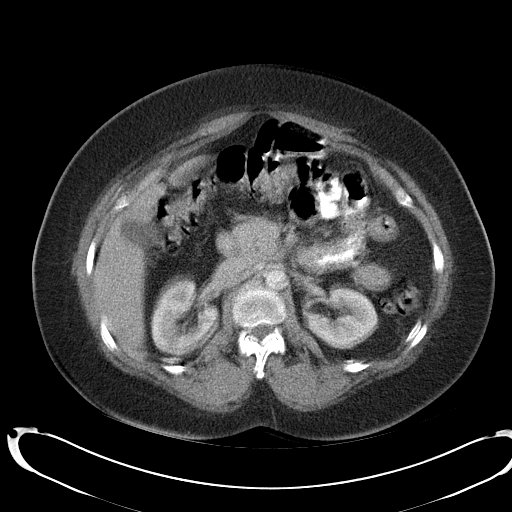
[im 64/93  bone]
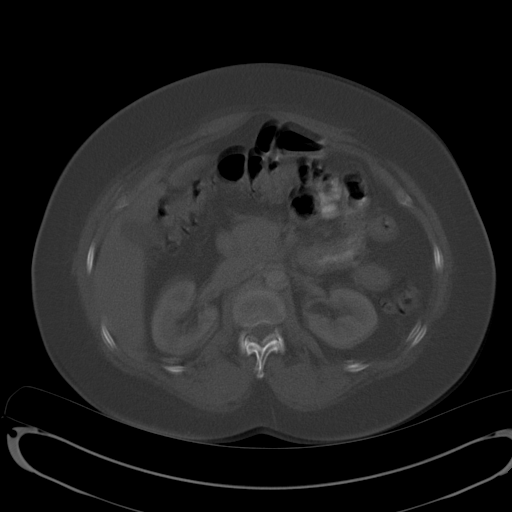
[im 70/93  soft-tissue]
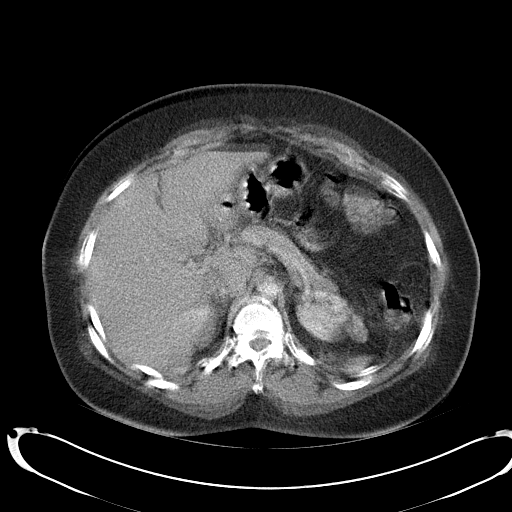
[im 81/93  soft-tissue]
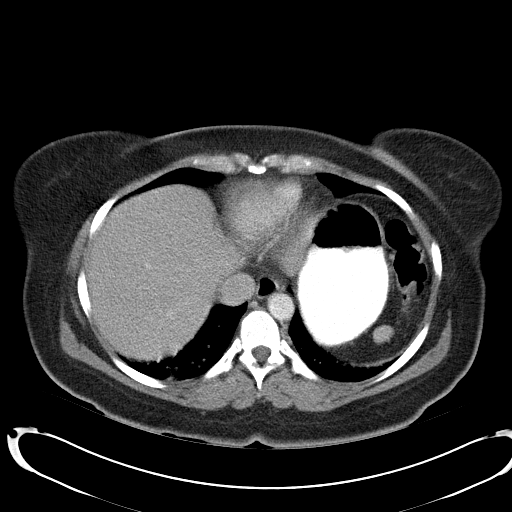
[im 87/93  soft-tissue]
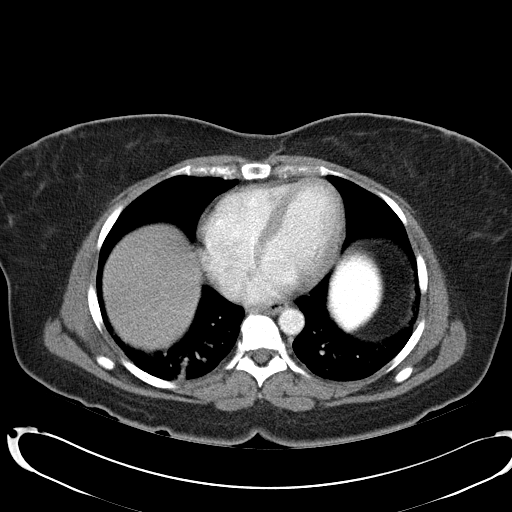

[Series 4: abd_pel_with 3.0 spo cor · coronal · 0.67mm/px · 3 of 94 slices shown]
[im 32/94  soft-tissue]
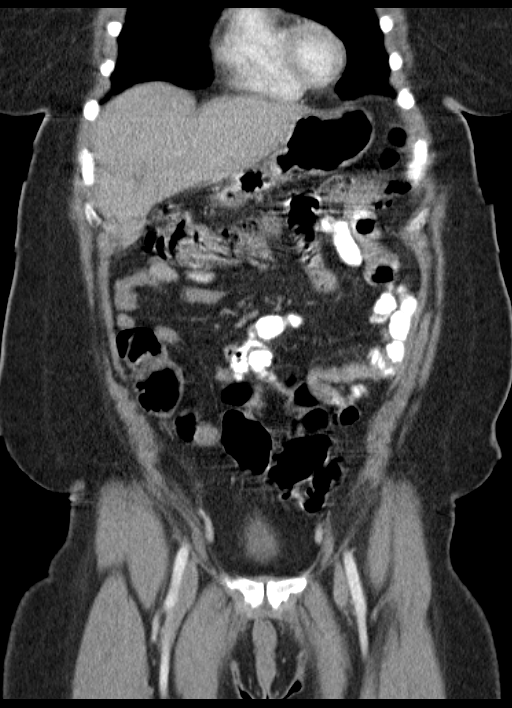
[im 42/94  soft-tissue]
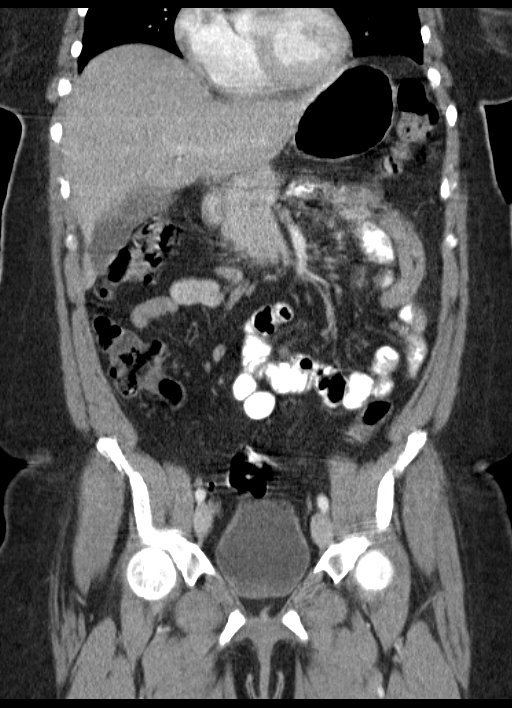
[im 52/94  soft-tissue]
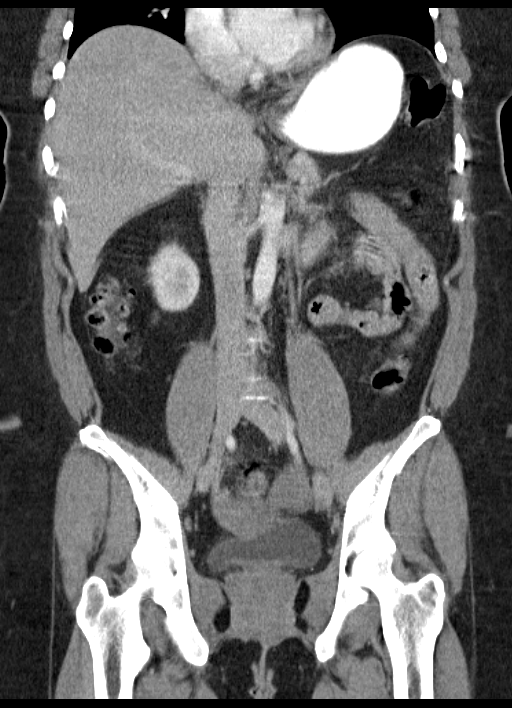

[15 of 46 positions shown; findings below may reference images not displayed]

FINDINGS: Images through the upper abdomen are degraded by motion despite
repeating some of these images.

Lower chest: Mild dependent atelectasis at both lung bases. No
significant pleural or pericardial effusion. Small hiatal hernia.

Hepatobiliary: The liver is normal in density without focal
abnormality. No evidence of gallstones, gallbladder wall thickening
or biliary dilatation.

Pancreas: Unremarkable. No pancreatic ductal dilatation or
surrounding inflammatory changes.

Spleen: The spleen is small without focal abnormality or surrounding
inflammatory change.

Adrenals/Urinary Tract: Both adrenal glands appear normal.The
kidneys appear normal without evidence of urinary tract calculus,
suspicious lesion or hydronephrosis. No bladder abnormalities are
seen.

Stomach/Bowel: Diverticular changes throughout the distal colon.
There is suspected mild wall thickening of the proximal descending
colon with mild surrounding inflammatory change. There is no focal
extraluminal fluid collection. There is no evidence of small bowel
distension or wall thickening.The appendix appears normal.

Vascular/Lymphatic: There are no enlarged abdominal or pelvic lymph
nodes. No significant vascular findings are present.

Reproductive: Status post partial hysterectomy. There is a 4.6 mm
right adnexal mass on image 68 which measures soft tissue density.
Within the left adnexa, there is a 3.3 cm soft tissue structure on
image 67 which is probably a normal ovary.

Other: No evidence of abdominal wall mass or hernia.

Musculoskeletal: No acute or significant osseous findings.
IMPRESSION: 1. Suspected mild diverticulitis just distal to the splenic flexure
of the colon. No evidence of bowel obstruction or abscess.
2. No evidence of appendicitis.
3. Indeterminate right adnexal lesion status post apparent partial
hysterectomy. This could reflect a hemorrhagic ovarian cyst.
Consider ultrasound correlation or follow-up.
4. Bibasilar atelectasis.

## 2016-08-22 HISTORY — PX: COLONOSCOPY: SHX174

## 2020-11-30 ENCOUNTER — Telehealth: Payer: Self-pay

## 2020-11-30 NOTE — Telephone Encounter (Signed)
Returned patient's VM. To see about scheduling an appointment. LVM.

## 2020-12-21 DIAGNOSIS — D3912 Neoplasm of uncertain behavior of left ovary: Secondary | ICD-10-CM | POA: Insufficient documentation

## 2020-12-21 DIAGNOSIS — C562 Malignant neoplasm of left ovary: Secondary | ICD-10-CM | POA: Insufficient documentation

## 2020-12-21 HISTORY — PX: OTHER SURGICAL HISTORY: SHX169

## 2021-01-07 ENCOUNTER — Ambulatory Visit
Admission: RE | Admit: 2021-01-07 | Discharge: 2021-01-07 | Disposition: A | Discharge status: Home / Self Care | Payer: Self-pay | Source: Ambulatory Visit | Attending: Gynecologic Oncology | Admitting: Gynecologic Oncology

## 2021-01-07 ENCOUNTER — Encounter: Payer: Self-pay | Admitting: Gynecologic Oncology

## 2021-01-07 ENCOUNTER — Other Ambulatory Visit: Payer: Self-pay

## 2021-01-07 ENCOUNTER — Telehealth: Payer: Self-pay | Admitting: *Deleted

## 2021-01-07 DIAGNOSIS — R109 Unspecified abdominal pain: Secondary | ICD-10-CM

## 2021-01-07 NOTE — Telephone Encounter (Signed)
Spoke with the patient and scheduled a new patient appt for 5/23 with Dr Denman George at 9:45 am; patient given an arrival time of 9:15 am. Patient given the address and phone number for the clinic, along with the policy for mask and visitors

## 2021-01-11 ENCOUNTER — Other Ambulatory Visit: Payer: Self-pay

## 2021-01-11 ENCOUNTER — Encounter: Payer: Self-pay | Admitting: Gynecologic Oncology

## 2021-01-11 ENCOUNTER — Inpatient Hospital Stay: Payer: Commercial Managed Care - PPO | Attending: Gynecologic Oncology | Admitting: Gynecologic Oncology

## 2021-01-11 VITALS — BP 177/88 | HR 100 | Temp 98.1°F | Resp 20 | Ht 64.0 in | Wt 150.0 lb

## 2021-01-11 DIAGNOSIS — E1165 Type 2 diabetes mellitus with hyperglycemia: Secondary | ICD-10-CM

## 2021-01-11 DIAGNOSIS — K219 Gastro-esophageal reflux disease without esophagitis: Secondary | ICD-10-CM | POA: Diagnosis not present

## 2021-01-11 DIAGNOSIS — I10 Essential (primary) hypertension: Secondary | ICD-10-CM | POA: Insufficient documentation

## 2021-01-11 DIAGNOSIS — Z90721 Acquired absence of ovaries, unilateral: Secondary | ICD-10-CM | POA: Insufficient documentation

## 2021-01-11 DIAGNOSIS — E119 Type 2 diabetes mellitus without complications: Secondary | ICD-10-CM | POA: Diagnosis not present

## 2021-01-11 DIAGNOSIS — Z90710 Acquired absence of both cervix and uterus: Secondary | ICD-10-CM | POA: Insufficient documentation

## 2021-01-11 DIAGNOSIS — D3912 Neoplasm of uncertain behavior of left ovary: Secondary | ICD-10-CM | POA: Diagnosis present

## 2021-01-11 DIAGNOSIS — C562 Malignant neoplasm of left ovary: Secondary | ICD-10-CM

## 2021-01-11 NOTE — Patient Instructions (Signed)
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 This involves key hole surgery with removal of the right ovary, fatty tissue biopsies and washings of fluid from the abdomen.  This will be performed in June.  Dr Serita Grit office will contact you with the schedule for surgery and have you return to see Joylene John, her nurse practitioner, for a preoperative visit.  Dr Serita Grit office can be contacted at [phone].

## 2021-01-11 NOTE — H&P (View-Only) (Signed)
Consult Note: Gyn-Onc  Consult was requested by Dr. Addison Bailey for the evaluation of Beth Freeman 55 y.o. female  CC:  Chief Complaint  Patient presents with  . Granulosa cell carcinoma of ovary, left [facility])    Assessment/Plan:  [name]  is a 55 y.o.  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  I discussed with the patient that the options were: 1/ proceed with second staging surgery (ideally at approximately 6 weeks postop from her first surgery to minimize risk of complications). This would involve removal of the right tube and ovary, omentectomy, peritoneal biopsies and washings. This would provide definitive staging information to guide adjuvant therapy Rx. I explained that lymph node involvement occurs in only approximately 1% of clinical stage I granulosa cell tumor, therefore lymphadenectomy is not recommended.  2/ clinically stage her tumor based on initial operative findings. I would not recommend adjuvant therapy based on her pathology report.  I recommended the first option (definitive staging) with the caveat that this would most likely yield negative staging information and would be unlikely change therapy recommendations. However, is the most definitive, and I have some concerns given that a cystectomy and not an oophorectomy was performed on the right. I explained that reoperation is associated with risk of complications from adhesions, as well as other surgical complications  bleeding, infection, damage to internal organs (such as bladder,ureters, bowels), blood clot, reoperation and rehospitalization. Additionally healing and recovery would be protracted given that this is a second surgery within 2 months.  The patient is in agreement with proceeding with surgery.  She will have a preoperative visit prior to the date.  She will have inhibin B and AMH levels drawn preop.    HPI: [name] is a 55 year old woman who was  seen in consultation at the request of Dr Addison Bailey for evaluation of a left ovarian granulosa cell tumor found incidentally at the time of an LSO (and right ovarian cystectomy) for bilateral ovarian cysts on 12/21/20.  The patient has a remote history of a hysterectomy for benign indications approximately 15 years prior to diagnosis.  She reported having an episode of severe dyspareunia on 12/06/2020.  This sent the patient to the emergency room in [facility] where a CT scan of the abdomen and pelvis was performed which demonstrated bilateral heterogeneously enhancing adnexal masses likely ovarian in origin.  The right-sided mass measured 7.6 cm and the left-sided mass measured 6.8 cm.  She sought evaluation by her gynecologist who performed a Ca1 125 which was normal at 9.2, normal CEA at 2.3, normal HD4.  They proceeded with planned surgery with either BSO or USO and ovarian cystectomy.  This surgical procedure took place on 12/21/2020 with Dr. Addison Bailey and began as a laparoscopy.  Intraoperative findings were significant for large bilateral ovarian cysts.  A decision was made to convert to laparotomy via her Pfannenstiel incision. A left salpingo-oophorectomy and right ovarian cystectomy was performed. The operative report does not mention evidence of extraovarian disease.  Based on the operative report the ovarian masses were removed intact that this is not clearly stipulated in the note.  Final pathology from the procedure revealed a left ovary containing adult granulosa cell tumor that was 8.5 cm there were no lymph nodes, washings or peritoneal biopsies for staging purposes.  The surface of the ovary did not demonstrate obvious surface involvement however the left ovary was ruptured focally.  The right ovarian cyst  that was removed revealed inflammation and granulation tissue without granulosa cell tumor.  The right ovary cyst was ruptured at the time was received by pathology.  Based on the apparent  rupture of the left ovarian granulosa cell tumor, and empiric stage of stage Ic was assigned.  Her medical history is most significant for hypertension, type II DM.  Her surgical history is most significant for abdominal hysterectomy for benign indications.  Her family cancer history is unremarkable.  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 She lives with her husband.   Current Meds:  Outpatient Encounter Medications as of 01/11/2021  Medication Sig  . chlorthalidone (HYGROTON) 25 MG tablet Take 25 mg by mouth daily.  . Ferrous Sulfate (IRON PO) Take 65 mg by mouth daily.  . Potassium 99 MG TABS Take 1 tablet by mouth daily.  . [DISCONTINUED] metFORMIN (GLUCOPHAGE) 500 MG tablet Take 500 mg by mouth daily.  . Cholecalciferol (VITAMIN D3 PO) Take 75 mcg by mouth daily. (Patient not taking: Reported on 01/07/2021)  . Multiple Vitamin (MULTIVITAMIN) capsule Take 1 capsule by mouth daily. (Patient not taking: Reported on 01/07/2021)  . oxyCODONE-acetaminophen (PERCOCET/ROXICET) 5-325 MG tablet Take 1 tablet by mouth every 4 (four) hours as needed. (Patient not taking: Reported on 01/07/2021)  . vitamin B-12 (CYANOCOBALAMIN) 500 MCG tablet Take 500 mcg by mouth daily. (Patient not taking: Reported on 01/07/2021)  . [DISCONTINUED] ciprofloxacin (CIPRO) 500 MG tablet Take 1 tablet (500 mg total) by mouth 2 (two) times daily.  . [DISCONTINUED] HYDROcodone-acetaminophen (NORCO) 5-325 MG per tablet Take 1 tablet by mouth every 6 (six) hours as needed for moderate pain.  . [DISCONTINUED] losartan-hydrochlorothiazide (HYZAAR) 100-12.5 MG tablet Take 1 tablet by mouth daily.  . [DISCONTINUED] losartan-hydrochlorothiazide (HYZAAR) 100-25 MG per tablet Take 1 tablet by mouth daily.  . [DISCONTINUED] metroNIDAZOLE (FLAGYL) 500 MG tablet Take 1 tablet (500 mg total) by mouth 3 (three) times daily.   No facility-administered encounter medications on file as of 01/11/2021.    Allergy:  Allergies   Allergen Reactions  . Ace Inhibitors     Other reaction(s): Cough Cough Cough     Social Hx:   Social History   Socioeconomic History  . Marital status: Married    Spouse name: Not on file  . Number of children: Not on file  . Years of education: Not on file  . Highest education level: Not on file  Occupational History  . Not on file  Tobacco Use  . Smoking status: Never Smoker  . Smokeless tobacco: Never Used  Vaping Use  . Vaping Use: Never used  Substance and Sexual Activity  . Alcohol use: No  . Drug use: No  . Sexual activity: Not Currently    Birth control/protection: Surgical  Other Topics Concern  . Not on file  Social History Narrative  . Not on file   Social Determinants of Health   Financial Resource Strain: Not on file  Food Insecurity: Not on file  Transportation Needs: Not on file  Physical Activity: Not on file  Stress: Not on file  Social Connections: Not on file  Intimate Partner Violence: Not on file    Past Surgical Hx:  Past Surgical History:  Procedure Laterality Date  . ABDOMINAL HYSTERECTOMY    . left salpingo-oophorectomy Left 12/21/2020    Past Medical Hx:  Past Medical History:  Diagnosis Date  . Abnormal abdominal CT scan    ? adnexal lesion  . Diverticulitis  hosp 10/2014.   . Diverticulosis   . GERD (gastroesophageal reflux disease)   . Hypertension   . Type II diabetes mellitus ([place])     Past Gynecological History:  No LMP recorded. Patient has had a hysterectomy.  Family Hx:  Family History  Problem Relation Age of Onset  . Breast cancer Mother   . Colon cancer Neg Hx   . Ovarian cancer Neg Hx   . Endometrial cancer Neg Hx   . Pancreatic cancer Neg Hx   . Prostate cancer Neg Hx     Review of Systems:  Constitutional  Feels well,  See HPI  ENT Normal appearing ears and nares bilaterally Skin/Breast  No rash, sores, jaundice, itching, dryness Cardiovascular  No chest pain, shortness of breath, or  edema  Pulmonary  No cough or wheeze.  Gastro Intestinal  No nausea, vomitting, or diarrhoea. No bright red blood per rectum, no abdominal pain, change in bowel movement, or constipation.  Genito Urinary  No frequency, urgency, dysuria,  Musculo Skeletal  No myalgia, arthralgia, joint swelling or pain  Neurologic  No weakness, numbness, change in gait,  Psychology  No depression, anxiety, insomnia.   Vitals:  Blood pressure (!) 177/88, pulse 100, temperature 98.1 F (36.7 C), temperature source Oral, resp. rate 20, height 5\' 4"  (1.626 m), weight 150 lb (68 kg), SpO2 100 %.  Physical Exam: WD in NAD Neck  Supple NROM, without any enlargements.  Lymph Node Survey No cervical supraclavicular or inguinal adenopathy Cardiovascular  Pulse normal rate, regularity and rhythm. S1 and S2 normal.  Lungs  Clear to auscultation bilateraly, without wheezes/crackles/rhonchi. Good air movement.  Skin  No rash/lesions/breakdown  Psychiatry  Alert and oriented to person, place, and time  Abdomen  Normoactive bowel sounds, abdomen soft, non-tender and nonobese without evidence of hernia. Incisions healing normally Back No CVA tenderness Genito Urinary  Vulva/vagina: Normal external female genitalia.  No lesions. No discharge or bleeding.  Bladder/urethra:  No lesions or masses, well supported bladder  Vagina: normal  Cervix and uterus surgically absent  Adnexa: no palpable masses. Rectal  deferred Extremities  No bilateral cyanosis, clubbing or edema.  60 minutes of total time was spent for this patient encounter, including preparation, face-to-face counseling with the patient and coordination of care, review of imaging (results and images), communication with the referring provider and documentation of the encounter.   Thereasa Solo, MD  01/11/2021, 10:56 AM

## 2021-01-11 NOTE — Progress Notes (Signed)
Consult Note: Gyn-Onc  Consult was requested by Dr. Riddick for the evaluation of Beth Freeman 55 y.o. female  CC:  Chief Complaint  Patient presents with  . Granulosa cell carcinoma of ovary, left (HCC)    Assessment/Plan:  Beth Freeman  is a 55 y.o.  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  I discussed with the patient that the options were: 1/ proceed with second staging surgery (ideally at approximately 6 weeks postop from her first surgery to minimize risk of complications). This would involve removal of the right tube and ovary, omentectomy, peritoneal biopsies and washings. This would provide definitive staging information to guide adjuvant therapy Rx. I explained that lymph node involvement occurs in only approximately 1% of clinical stage I granulosa cell tumor, therefore lymphadenectomy is not recommended.  2/ clinically stage her tumor based on initial operative findings. I would not recommend adjuvant therapy based on her pathology report.  I recommended the first option (definitive staging) with the caveat that this would most likely yield negative staging information and would be unlikely change therapy recommendations. However, is the most definitive, and I have some concerns given that a cystectomy and not an oophorectomy was performed on the right. I explained that reoperation is associated with risk of complications from adhesions, as well as other surgical complications  bleeding, infection, damage to internal organs (such as bladder,ureters, bowels), blood clot, reoperation and rehospitalization. Additionally healing and recovery would be protracted given that this is a second surgery within 2 months.  The patient is in agreement with proceeding with surgery.  She will have a preoperative visit prior to the date.  She will have inhibin B and AMH levels drawn preop.    HPI: [name] is a 55 year old woman who was  seen in consultation at the request of Dr Riddick for evaluation of a left ovarian granulosa cell tumor found incidentally at the time of an LSO (and right ovarian cystectomy) for bilateral ovarian cysts on 12/21/20.  The patient has a remote history of a hysterectomy for benign indications approximately 15 years prior to diagnosis.  She reported having an episode of severe dyspareunia on 12/06/2020.  This sent the patient to the emergency room in [facility] where a CT scan of the abdomen and pelvis was performed which demonstrated bilateral heterogeneously enhancing adnexal masses likely ovarian in origin.  The right-sided mass measured 7.6 cm and the left-sided mass measured 6.8 cm.  She sought evaluation by her gynecologist who performed a Ca1 125 which was normal at 9.2, normal CEA at 2.3, normal HD4.  They proceeded with planned surgery with either BSO or USO and ovarian cystectomy.  This surgical procedure took place on 12/21/2020 with Dr. Riddick and began as a laparoscopy.  Intraoperative findings were significant for large bilateral ovarian cysts.  A decision was made to convert to laparotomy via her Pfannenstiel incision. A left salpingo-oophorectomy and right ovarian cystectomy was performed. The operative report does not mention evidence of extraovarian disease.  Based on the operative report the ovarian masses were removed intact that this is not clearly stipulated in the note.  Final pathology from the procedure revealed a left ovary containing adult granulosa cell tumor that was 8.5 cm there were no lymph nodes, washings or peritoneal biopsies for staging purposes.  The surface of the ovary did not demonstrate obvious surface involvement however the left ovary was ruptured focally.  The right ovarian cyst   that was removed revealed inflammation and granulation tissue without granulosa cell tumor.  The right ovary cyst was ruptured at the time was received by pathology.  Based on the apparent  rupture of the left ovarian granulosa cell tumor, and empiric stage of stage Ic was assigned.  Her medical history is most significant for hypertension, type II DM.  Her surgical history is most significant for abdominal hysterectomy for benign indications.  Her family cancer history is unremarkable.  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 She lives with her husband.   Current Meds:  Outpatient Encounter Medications as of 01/11/2021  Medication Sig  . chlorthalidone (HYGROTON) 25 MG tablet Take 25 mg by mouth daily.  . Ferrous Sulfate (IRON PO) Take 65 mg by mouth daily.  . Potassium 99 MG TABS Take 1 tablet by mouth daily.  . [DISCONTINUED] metFORMIN (GLUCOPHAGE) 500 MG tablet Take 500 mg by mouth daily.  . Cholecalciferol (VITAMIN D3 PO) Take 75 mcg by mouth daily. (Patient not taking: Reported on 01/07/2021)  . Multiple Vitamin (MULTIVITAMIN) capsule Take 1 capsule by mouth daily. (Patient not taking: Reported on 01/07/2021)  . oxyCODONE-acetaminophen (PERCOCET/ROXICET) 5-325 MG tablet Take 1 tablet by mouth every 4 (four) hours as needed. (Patient not taking: Reported on 01/07/2021)  . vitamin B-12 (CYANOCOBALAMIN) 500 MCG tablet Take 500 mcg by mouth daily. (Patient not taking: Reported on 01/07/2021)  . [DISCONTINUED] ciprofloxacin (CIPRO) 500 MG tablet Take 1 tablet (500 mg total) by mouth 2 (two) times daily.  . [DISCONTINUED] HYDROcodone-acetaminophen (NORCO) 5-325 MG per tablet Take 1 tablet by mouth every 6 (six) hours as needed for moderate pain.  . [DISCONTINUED] losartan-hydrochlorothiazide (HYZAAR) 100-12.5 MG tablet Take 1 tablet by mouth daily.  . [DISCONTINUED] losartan-hydrochlorothiazide (HYZAAR) 100-25 MG per tablet Take 1 tablet by mouth daily.  . [DISCONTINUED] metroNIDAZOLE (FLAGYL) 500 MG tablet Take 1 tablet (500 mg total) by mouth 3 (three) times daily.   No facility-administered encounter medications on file as of 01/11/2021.    Allergy:  Allergies   Allergen Reactions  . Ace Inhibitors     Other reaction(s): Cough Cough Cough     Social Hx:   Social History   Socioeconomic History  . Marital status: Married    Spouse name: Not on file  . Number of children: Not on file  . Years of education: Not on file  . Highest education level: Not on file  Occupational History  . Not on file  Tobacco Use  . Smoking status: Never Smoker  . Smokeless tobacco: Never Used  Vaping Use  . Vaping Use: Never used  Substance and Sexual Activity  . Alcohol use: No  . Drug use: No  . Sexual activity: Not Currently    Birth control/protection: Surgical  Other Topics Concern  . Not on file  Social History Narrative  . Not on file   Social Determinants of Health   Financial Resource Strain: Not on file  Food Insecurity: Not on file  Transportation Needs: Not on file  Physical Activity: Not on file  Stress: Not on file  Social Connections: Not on file  Intimate Partner Violence: Not on file    Past Surgical Hx:  Past Surgical History:  Procedure Laterality Date  . ABDOMINAL HYSTERECTOMY    . left salpingo-oophorectomy Left 12/21/2020    Past Medical Hx:  Past Medical History:  Diagnosis Date  . Abnormal abdominal CT scan    ? adnexal lesion  . Diverticulitis      hosp 10/2014.   . Diverticulosis   . GERD (gastroesophageal reflux disease)   . Hypertension   . Type II diabetes mellitus ([place])     Past Gynecological History:  No LMP recorded. Patient has had a hysterectomy.  Family Hx:  Family History  Problem Relation Age of Onset  . Breast cancer Mother   . Colon cancer Neg Hx   . Ovarian cancer Neg Hx   . Endometrial cancer Neg Hx   . Pancreatic cancer Neg Hx   . Prostate cancer Neg Hx     Review of Systems:  Constitutional  Feels well,  See HPI  ENT Normal appearing ears and nares bilaterally Skin/Breast  No rash, sores, jaundice, itching, dryness Cardiovascular  No chest pain, shortness of breath, or  edema  Pulmonary  No cough or wheeze.  Gastro Intestinal  No nausea, vomitting, or diarrhoea. No bright red blood per rectum, no abdominal pain, change in bowel movement, or constipation.  Genito Urinary  No frequency, urgency, dysuria,  Musculo Skeletal  No myalgia, arthralgia, joint swelling or pain  Neurologic  No weakness, numbness, change in gait,  Psychology  No depression, anxiety, insomnia.   Vitals:  Blood pressure (!) 177/88, pulse 100, temperature 98.1 F (36.7 C), temperature source Oral, resp. rate 20, height 5\' 4"  (1.626 m), weight 150 lb (68 kg), SpO2 100 %.  Physical Exam: WD in NAD Neck  Supple NROM, without any enlargements.  Lymph Node Survey No cervical supraclavicular or inguinal adenopathy Cardiovascular  Pulse normal rate, regularity and rhythm. S1 and S2 normal.  Lungs  Clear to auscultation bilateraly, without wheezes/crackles/rhonchi. Good air movement.  Skin  No rash/lesions/breakdown  Psychiatry  Alert and oriented to person, place, and time  Abdomen  Normoactive bowel sounds, abdomen soft, non-tender and nonobese without evidence of hernia. Incisions healing normally Back No CVA tenderness Genito Urinary  Vulva/vagina: Normal external female genitalia.  No lesions. No discharge or bleeding.  Bladder/urethra:  No lesions or masses, well supported bladder  Vagina: normal  Cervix and uterus surgically absent  Adnexa: no palpable masses. Rectal  deferred Extremities  No bilateral cyanosis, clubbing or edema.  60 minutes of total time was spent for this patient encounter, including preparation, face-to-face counseling with the patient and coordination of care, review of imaging (results and images), communication with the referring provider and documentation of the encounter.   Thereasa Solo, MD  01/11/2021, 10:56 AM

## 2021-01-12 ENCOUNTER — Other Ambulatory Visit: Payer: Self-pay | Admitting: Gynecologic Oncology

## 2021-01-12 DIAGNOSIS — C562 Malignant neoplasm of left ovary: Secondary | ICD-10-CM

## 2021-01-13 NOTE — Patient Instructions (Signed)
DUE TO COVID-19 ONLY ONE VISITOR IS ALLOWED TO COME WITH YOU AND STAY IN THE WAITING ROOM ONLY DURING PRE OP AND PROCEDURE DAY OF SURGERY. THE 1 VISITOR  MAY VISIT WITH YOU AFTER SURGERY IN YOUR PRIVATE ROOM DURING VISITING HOURS ONLY!                Beth Freeman   Your procedure is scheduled on: 02/02/21   Report to [facility] Main  Entrance   Report to Short stay at 5:15 AM     Call this number if you have problems the morning of surgery [phone]    Remember: Do not eat food after Midnight.  You may have clear liquids until 4:30 AM   BRUSH YOUR TEETH MORNING OF SURGERY AND RINSE YOUR MOUTH OUT, NO CHEWING GUM CANDY OR MINTS.     Take these medicines the morning of surgery with A SIP OF WATER: None     How to Manage Your Diabetes Before and After Surgery  Why is it important to control my blood sugar before and after surgery? . Improving blood sugar levels before and after surgery helps healing and can limit problems. . A way of improving blood sugar control is eating a healthy diet by: o  Eating less sugar and carbohydrates o  Increasing activity/exercise o  Talking with your doctor about reaching your blood sugar goals . High blood sugars (greater than 180 mg/dL) can raise your risk of infections and slow your recovery, so you will need to focus on controlling your diabetes during the weeks before surgery. . Make sure that the doctor who takes care of your diabetes knows about your planned surgery including the date and location.  How do I manage my blood sugar before surgery? . Check your blood sugar at least 4 times a day, starting 2 days before surgery, to make sure that the level is not too high or low. o Check your blood sugar the morning of your surgery when you wake up and every 2 hours until you get to the Short Stay unit. . If your blood sugar is less than 70 mg/dL, you will need to treat for low blood sugar: o Do not take insulin. o Treat a low  blood sugar (less than 70 mg/dL) with  cup of clear juice (cranberry or apple), 4 glucose tablets, OR glucose gel. o Recheck blood sugar in 15 minutes after treatment (to make sure it is greater than 70 mg/dL). If your blood sugar is not greater than 70 mg/dL on recheck, call [phone] for further instructions. . Report your blood sugar to the short stay nurse when you get to Short Stay.  . If you are admitted to the hospital after surgery: o Your blood sugar will be checked by the staff and you will probably be given insulin after surgery (instead of oral diabetes medicines) to make sure you have good blood sugar levels. o The goal for blood sugar control after surgery is 80-180 mg/dL.   WHAT DO I DO ABOUT MY DIABETES MEDICATION?  Marland Kitchen Do not take oral diabetes medicines (pills) the morning of surgery.                               You may not have any metal on your body including              piercings  Do not wear jewelry,  lotions, powders  or deodorant                 Do not bring valuables to the hospital. [place].  Contacts, dentures or bridgework may not be worn into surgery.      Patients discharged the day of surgery will not be allowed to drive home.   IF YOU ARE HAVING SURGERY AND GOING HOME THE SAME DAY, YOU MUST HAVE AN ADULT TO DRIVE YOU HOME AND BE WITH YOU FOR 24 HOURS.  YOU MAY GO HOME BY TAXI OR UBER OR ORTHERWISE, BUT AN ADULT MUST ACCOMPANY YOU HOME AND STAY WITH YOU FOR 24 HOURS.  Name and phone number of your driver:  Special Instructions: N/A              Please read over the following fact sheets you were given: _____________________________________________________________________             [facility] - Preparing for Surgery Before surgery, you can play an important role.  Because skin is not sterile, your skin needs to be as free of germs as possible.  You can reduce the number of germs on your skin  by washing with CHG (chlorahexidine gluconate) soap before surgery.  CHG is an antiseptic cleaner which kills germs and bonds with the skin to continue killing germs even after washing. Please DO NOT use if you have an allergy to CHG or antibacterial soaps.  If your skin becomes reddened/irritated stop using the CHG and inform your nurse when you arrive at Short Stay. Do not shave (including legs and underarms) for at least 48 hours prior to the first CHG shower.    Please follow these instructions carefully:  1.  Shower with CHG Soap the night before surgery and the  morning of Surgery.  2.  If you choose to wash your hair, wash your hair first as usual with your  normal  shampoo.  3.  After you shampoo, rinse your hair and body thoroughly to remove the  shampoo.                                        4.  Use CHG as you would any other liquid soap.  You can apply chg directly  to the skin and wash                       Gently with a scrungie or clean washcloth.  5.  Apply the CHG Soap to your body ONLY FROM THE NECK DOWN.   Do not use on face/ open                           Wound or open sores. Avoid contact with eyes, ears mouth and genitals (private parts).                       Wash face,  Genitals (private parts) with your normal soap.             6.  Wash thoroughly, paying special attention to the area where your surgery  will be performed.  7.  Thoroughly rinse your body with warm water from the neck down.  8.  DO NOT  shower/wash with your normal soap after using and rinsing off  the CHG Soap.             9.  Pat yourself dry with a clean towel.            10.  Wear clean pajamas.            11.  Place clean sheets on your bed the night of your first shower and do not  sleep with pets. Day of Surgery : Do not apply any lotions/deodorants the morning of surgery.  Please wear clean clothes to the hospital/surgery center.  FAILURE TO FOLLOW THESE INSTRUCTIONS MAY RESULT IN THE CANCELLATION  OF YOUR SURGERY PATIENT SIGNATURE_________________________________  NURSE SIGNATURE__________________________________  ________________________________________________________________________

## 2021-01-14 ENCOUNTER — Encounter: Payer: Self-pay | Admitting: Gynecologic Oncology

## 2021-01-14 ENCOUNTER — Telehealth: Payer: Self-pay

## 2021-01-14 ENCOUNTER — Encounter (HOSPITAL_COMMUNITY)
Admission: RE | Admit: 2021-01-14 | Discharge: 2021-01-14 | Disposition: A | Discharge status: Home / Self Care | Payer: Managed Care, Other (non HMO) | Source: Ambulatory Visit | Attending: Gynecologic Oncology | Admitting: Gynecologic Oncology

## 2021-01-14 ENCOUNTER — Other Ambulatory Visit: Payer: Self-pay

## 2021-01-14 ENCOUNTER — Encounter (HOSPITAL_COMMUNITY): Payer: Self-pay

## 2021-01-14 NOTE — Telephone Encounter (Addendum)
Received phone call from [place] requesting work note for her husband Beth Freeman for upcoming surgery with Dr. Denman George on June 14th. The dates she requested on the note are June 14th-16th.  The note has been completed and will be sent to [email or web address] as requested by Lattie Haw.

## 2021-01-14 NOTE — Progress Notes (Signed)
COVID Vaccine Completed:Yes Date COVID Vaccine completed:01/15/20 COVID vaccine manufacturer [employer]   PCP - Dr. Skeet Simmer Cardiologist - none  Chest x-ray - no EKG - 12/21/20-care everywhere Stress Test - no ECHO -no  Cardiac Cath - no Pacemaker/ICD device last checked:NA  Sleep Study - no CPAP -   Fasting Blood Sugar - Pt doesn't test Checks Blood Sugar _____ times a day  Blood Thinner Instructions:NA Aspirin Instructions: Last Dose:  Anesthesia review:   Patient denies shortness of breath, fever, cough and chest pain at PAT appointment Yes. Pt walks 3 times a week and has no SOB climbing 5 flights of stairs, doing housework and ADLs.  Patient verbalized understanding of instructions that were given to them at the PAT appointment. Patient was also instructed that they will need to review over the PAT instructions again at home before surgery.Yes

## 2021-01-19 ENCOUNTER — Telehealth: Payer: Self-pay | Admitting: *Deleted

## 2021-01-19 NOTE — Telephone Encounter (Signed)
Patient returned call and was scheduled for a pre op on 6/10 with [facility]

## 2021-01-19 NOTE — Telephone Encounter (Signed)
Called and left the patient a message to call the office back. Patient needs to be scheduled for a pre op with [facility]

## 2021-01-28 NOTE — Patient Instructions (Signed)
Preparing for your Surgery  Plan for surgery on February 02, 2021 with Dr. Everitt Amber at [place] will be scheduled for a robotic assisted right salpingo-oophorectomy (removal of the right tube and ovary), omentectomy (removal of the omentum), staging with biopsies.   Pre-operative Testing -(DONE) You will receive a phone call from presurgical testing at [facility] to arrange for a pre-operative appointment and lab work.  -Bring your insurance card, copy of an advanced directive if applicable, medication list  -At that visit, you will be asked to sign a consent for a possible blood transfusion in case a transfusion becomes necessary during surgery. The need for a blood transfusion is rare but having consent is a necessary part of your care.     -You should not be taking blood thinners or aspirin at least ten days prior to surgery unless instructed by your surgeon.  -Do not take supplements such as fish oil (omega 3), red yeast rice, turmeric before your surgery. You want to avoid medications with aspirin in them including headache powders such as BC or Goody's), Excedrin migraine.  Day Before Surgery at [place] will be asked to take in a light diet the day before surgery. You will be advised you can have clear liquids up until 3 hours before your surgery.    Eat a light diet the day before surgery.  Examples including soups, broths, toast, yogurt, mashed potatoes.  AVOID GAS PRODUCING FOODS. Things to avoid include carbonated beverages (fizzy beverages, sodas), raw fruits and raw vegetables (uncooked), or beans.   If your bowels are filled with gas, your surgeon will have difficulty visualizing your pelvic organs which increases your surgical risks.  Your role in recovery Your role is to become active as soon as directed by your doctor, while still giving yourself time to heal.  Rest when you feel tired. You will be asked to do the following in order to speed your  recovery:  - Cough and breathe deeply. This helps to clear and expand your lungs and can prevent pneumonia after surgery.  - [place]. Do mild physical activity. Walking or moving your legs help your circulation and body functions return to normal. Do not try to get up or walk alone the first time after surgery.   -If you develop swelling on one leg or the other, pain in the back of your leg, redness/warmth in one of your legs, please call the office or go to the Emergency Room to have a doppler to rule out a blood clot. For shortness of breath, chest pain-seek care in the Emergency Room as soon as possible. - Actively manage your pain. Managing your pain lets you move in comfort. We will ask you to rate your pain on a scale of zero to 10. It is your responsibility to tell your doctor or nurse where and how much you hurt so your pain can be treated.  Special Considerations -If you are diabetic, you may be placed on insulin after surgery to have closer control over your blood sugars to promote healing and recovery.  This does not mean that you will be discharged on insulin.  If applicable, your oral antidiabetics will be resumed when you are tolerating a solid diet.  -Your final pathology results from surgery should be available around one week after surgery and the results will be relayed to you when available.  -Dr. Lahoma Crocker is the surgeon that assists your GYN  Oncologist with surgery.  If you end up staying the night, the next day after your surgery you will either see Dr. Denman George, Dr. Berline Lopes, or Dr. Lahoma Crocker.  -FMLA forms can be faxed to [phone] and please allow 5-7 business days for completion.  Pain Management After Surgery -You have been prescribed your pain medication and bowel regimen medications before surgery so that you can have these available when you are discharged from the hospital. The pain medication is for use ONLY AFTER surgery and a new  prescription will not be given.   -Make sure that you have Tylenol and Ibuprofen (We will recheck your kidney function. If still elevated, would recommend avoiding NSAID use) at home to use on a regular basis after surgery for pain control. We recommend alternating the medications every hour to six hours since they work differently and are processed in the body differently for pain relief.  -Review the attached handout on narcotic use and their risks and side effects.   Bowel Regimen -You have been prescribed Sennakot-S to take nightly to prevent constipation especially if you are taking the narcotic pain medication intermittently.  It is important to prevent constipation and drink adequate amounts of liquids. You can stop taking this medication when you are not taking pain medication and you are back on your normal bowel routine.  Risks of Surgery Risks of surgery are low but include bleeding, infection, damage to surrounding structures, re-operation, blood clots, and very rarely death.   Blood Transfusion Information (For the consent to be signed before surgery)  We will be checking your blood type before surgery so in case of emergencies, we will know what type of blood you would need.                                            WHAT IS A BLOOD TRANSFUSION?  A transfusion is the replacement of blood or some of its parts. Blood is made up of multiple cells which provide different functions. Red blood cells carry oxygen and are used for blood loss replacement. White blood cells fight against infection. Platelets control bleeding. Plasma helps clot blood. Other blood products are available for specialized needs, such as hemophilia or other clotting disorders. BEFORE THE TRANSFUSION  Who gives blood for transfusions?  You may be able to donate blood to be used at a later date on yourself (autologous donation). Relatives can be asked to donate blood. This is generally not any safer than if  you have received blood from a stranger. The same precautions are taken to ensure safety when a relative's blood is donated. Healthy volunteers who are fully evaluated to make sure their blood is safe. This is blood bank blood. Transfusion therapy is the safest it has ever been in the practice of medicine. Before blood is taken from a donor, a complete history is taken to make sure that person has no history of diseases nor engages in risky social behavior (examples are intravenous drug use or sexual activity with multiple partners). The donor's travel history is screened to minimize risk of transmitting infections, such as malaria. The donated blood is tested for signs of infectious diseases, such as HIV and hepatitis. The blood is then tested to be sure it is compatible with you in order to minimize the chance of a transfusion reaction. If you or a relative donates blood,  this is often done in anticipation of surgery and is not appropriate for emergency situations. It takes many days to process the donated blood. RISKS AND COMPLICATIONS Although transfusion therapy is very safe and saves many lives, the main dangers of transfusion include:  Getting an infectious disease. Developing a transfusion reaction. This is an allergic reaction to something in the blood you were given. Every precaution is taken to prevent this. The decision to have a blood transfusion has been considered carefully by your caregiver before blood is given. Blood is not given unless the benefits outweigh the risks.  AFTER SURGERY INSTRUCTIONS  Return to work: 4 weeks if applicable  Activity: 1. Be up and out of the bed during the day.  Take a nap if needed.  You may walk up steps but be careful and use the hand rail.  Stair climbing will tire you more than you think, you may need to stop part way and rest.   2. No lifting or straining for 6 weeks over 10 pounds. No pushing, pulling, straining for 6 weeks.  3. No driving for  around 1 week(s).  Do not drive if you are taking narcotic pain medicine and make sure that your reaction time has returned.   4. You can shower as soon as the next day after surgery. Shower daily.  Use your regular soap and water (not directly on the incision) and pat your incision(s) dry afterwards; don't rub.  No tub baths or submerging your body in water until cleared by your surgeon. If you have the soap that was given to you by pre-surgical testing that was used before surgery, you do not need to use it afterwards because this can irritate your incisions.   5. No sexual activity and nothing in the vagina for 2-4 weeks.  6. You may experience a small amount of clear drainage from your incisions, which is normal.  If the drainage persists, increases, or changes color please call the office.  7. Do not use creams, lotions, or ointments such as neosporin on your incisions after surgery until advised by your surgeon because they can cause removal of the dermabond glue on your incisions.    8. Take Tylenol or ibuprofen (if your kidney function allows) first for pain and only use narcotic pain medication for severe pain not relieved by the Tylenol or Ibuprofen.  Monitor your Tylenol intake to a max of 4,000 mg in a 24 hour period. You can alternate these medications after surgery.  Diet: 1. Low sodium Heart Healthy Diet is recommended but you are cleared to resume your normal (before surgery) diet after your procedure.  2. It is safe to use a laxative, such as Miralax or Colace, if you have difficulty moving your bowels. You have been prescribed Sennakot at bedtime every evening to keep bowel movements regular and to prevent constipation.    Wound Care: 1. Keep clean and dry.  Shower daily.  Reasons to call the Doctor: Fever - Oral temperature greater than 100.4 degrees Fahrenheit Foul-smelling vaginal discharge Difficulty urinating Nausea and vomiting Increased pain at the site of the  incision that is unrelieved with pain medicine. Difficulty breathing with or without chest pain New calf pain especially if only on one side Sudden, continuing increased vaginal bleeding with or without clots.   Contacts: For questions or concerns you should contact:  Dr. Everitt Amber at [phone]  Joylene John, NP at [phone]  After Hours: call [phone] and have the GYN Oncologist  paged/contacted (after 5 pm or on the weekends).  Messages sent via mychart are for non-urgent matters and are not responded to after hours so for urgent needs, please call the after hours number.

## 2021-01-28 NOTE — Progress Notes (Signed)
Patient here for a pre-operative appointment prior to her scheduled surgery on February 02, 2021. She is scheduled for a robotic assisted right salpingo-oophorectomy (removal of the right tube and ovary), omentectomy (removal of the omentum), staging with biopsies. The surgery was discussed in detail.  See after visit summary for additional details. Visual aids used to discuss items related to surgery including sequential compression stockings, foley catheter, IV pump, multi-modal pain regimen including tylenol, photo of the surgical robot, female reproductive system to discuss surgery in detail.      Discussed post-op pain management in detail including the aspects of the enhanced recovery pathway.  Advised her that a new prescription would be sent in for oxycodone and it is only to be used for after her upcoming surgery.  We discussed the use of tylenol post-op and to monitor for a maximum of 4,000 mg in a 24 hour period.  Also prescribed sennakot to be used after surgery and to hold if having loose stools.  Discussed bowel regimen in detail.     Discussed the use of lovenox pre-op, SCDs, and measures to take at home to prevent DVT including frequent mobility.  Reportable signs and symptoms of DVT discussed. Post-operative instructions discussed and expectations for after surgery. Incisional care discussed as well including reportable signs and symptoms including erythema, drainage, wound separation.     10 minutes spent with the patient.  Verbalizing understanding of material discussed. No needs or concerns voiced at the end of the visit.   Advised patient and family to call for any needs.  Advised that her post-operative medications had been prescribed and could be picked up at any time.   This appointment is included in the global surgical fee for pre-operative counseling.

## 2021-01-29 ENCOUNTER — Other Ambulatory Visit: Payer: Self-pay

## 2021-01-29 ENCOUNTER — Inpatient Hospital Stay: Payer: Commercial Managed Care - PPO | Attending: Gynecologic Oncology | Admitting: Gynecologic Oncology

## 2021-01-29 ENCOUNTER — Encounter (HOSPITAL_COMMUNITY)
Admission: RE | Admit: 2021-01-29 | Discharge: 2021-01-29 | Disposition: A | Discharge status: Home / Self Care | Payer: Commercial Managed Care - PPO | Source: Ambulatory Visit | Attending: Gynecologic Oncology | Admitting: Gynecologic Oncology

## 2021-01-29 VITALS — BP 143/90 | HR 82 | Temp 97.6°F | Resp 16 | Ht 64.0 in | Wt 153.0 lb

## 2021-01-29 DIAGNOSIS — Z01812 Encounter for preprocedural laboratory examination: Secondary | ICD-10-CM | POA: Diagnosis not present

## 2021-01-29 DIAGNOSIS — C562 Malignant neoplasm of left ovary: Secondary | ICD-10-CM

## 2021-01-29 LAB — BASIC METABOLIC PANEL
Anion gap: 11 (ref 5–15)
BUN: 15 mg/dL (ref 6–20)
CO2: 28 mmol/L (ref 22–32)
Calcium: 10 mg/dL (ref 8.9–10.3)
Chloride: 101 mmol/L (ref 98–111)
Creatinine, Ser: 0.83 mg/dL (ref 0.44–1.00)
GFR, Estimated: >60 mL/min (ref >60)
Glucose, Bld: 160 mg/dL — ABNORMAL HIGH (ref 70–99)
Potassium: 3.5 mmol/L (ref 3.5–5.1)
Sodium: 140 mmol/L (ref 135–145)

## 2021-01-29 LAB — URINALYSIS, ROUTINE W REFLEX MICROSCOPIC
Bacteria, UA: NONE SEEN
Bilirubin Urine: NEGATIVE
Glucose, UA: NEGATIVE mg/dL
Hgb urine dipstick: NEGATIVE
Ketones, ur: NEGATIVE mg/dL
Nitrite: NEGATIVE
Protein, ur: NEGATIVE mg/dL
Specific Gravity, Urine: 1.014 (ref 1.005–1.030)
pH: 7 (ref 5.0–8.0)

## 2021-01-29 LAB — CBC
HCT: 41.4 % (ref 36.0–46.0)
Hemoglobin: 13.8 g/dL (ref 12.0–15.0)
MCH: 31.7 pg (ref 26.0–34.0)
MCHC: 33.3 g/dL (ref 30.0–36.0)
MCV: 95 fL (ref 80.0–100.0)
Platelets: 264 10*3/uL (ref 150–400)
RBC: 4.36 MIL/uL (ref 3.87–5.11)
RDW: 12.1 % (ref 11.5–15.5)
WBC: 7.7 10*3/uL (ref 4.0–10.5)
nRBC: 0 % (ref 0.0–0.2)

## 2021-01-29 LAB — GLUCOSE, CAPILLARY: Glucose-Capillary: 156 mg/dL — ABNORMAL HIGH (ref 70–99)

## 2021-01-29 MED ORDER — SENNOSIDES-DOCUSATE SODIUM 8.6-50 MG PO TABS: 2.0000 | ORAL_TABLET | Freq: Every day | ORAL | 0 refills | Status: DC

## 2021-01-29 MED ORDER — OXYCODONE HCL 5 MG PO TABS: 5.0000 mg | ORAL_TABLET | ORAL | 0 refills | Status: AC | PRN

## 2021-02-01 ENCOUNTER — Telehealth: Payer: Self-pay

## 2021-02-01 NOTE — Anesthesia Preprocedure Evaluation (Addendum)
Anesthesia Evaluation  Patient identified by MRN, date of birth, ID band Patient awake    Reviewed: Allergy & Precautions, NPO status , Patient's Chart, lab work & pertinent test results  History of Anesthesia Complications Negative for: history of anesthetic complications  Airway Mallampati: II  TM Distance: >3 FB Neck ROM: Full    Dental  (+) Dental Advisory Given, Teeth Intact   Pulmonary neg pulmonary ROS,    breath sounds clear to auscultation       Cardiovascular hypertension, Pt. on medications (-) angina Rhythm:Regular Rate:Normal     Neuro/Psych negative neurological ROS     GI/Hepatic Neg liver ROS, GERD  Controlled,  Endo/Other  diabetes (glu 204), Oral Hypoglycemic Agents  Renal/GU negative Renal ROS     Musculoskeletal   Abdominal   Peds  Hematology negative hematology ROS (+)   Anesthesia Other Findings   Reproductive/Obstetrics                            Anesthesia Physical Anesthesia Plan  ASA: 3  Anesthesia Plan: General   Post-op Pain Management:    Induction: Intravenous  PONV Risk Score and Plan: 3 and Ondansetron, Dexamethasone and Scopolamine patch - Pre-op  Airway Management Planned: Oral ETT  Additional Equipment: None  Intra-op Plan:   Post-operative Plan: Extubation in OR  Informed Consent: I have reviewed the patients History and Physical, chart, labs and discussed the procedure including the risks, benefits and alternatives for the proposed anesthesia with the patient or authorized representative who has indicated his/her understanding and acceptance.     Dental advisory given  Plan Discussed with: CRNA and Surgeon  Anesthesia Plan Comments:        Anesthesia Quick Evaluation

## 2021-02-01 NOTE — Telephone Encounter (Signed)
Left message for Beth Freeman, following up with her pre-operatively.

## 2021-02-02 ENCOUNTER — Ambulatory Visit (HOSPITAL_COMMUNITY): Payer: Commercial Managed Care - PPO | Admitting: Anesthesiology

## 2021-02-02 ENCOUNTER — Encounter (HOSPITAL_COMMUNITY): Payer: Self-pay | Admitting: Gynecologic Oncology

## 2021-02-02 ENCOUNTER — Encounter (HOSPITAL_COMMUNITY)
Admission: RE | Disposition: A | Discharge status: Home / Self Care | Payer: Self-pay | Source: Ambulatory Visit | Attending: Gynecologic Oncology

## 2021-02-02 ENCOUNTER — Ambulatory Visit (HOSPITAL_COMMUNITY)
Admission: RE | Admit: 2021-02-02 | Discharge: 2021-02-02 | Disposition: A | Discharge status: Home / Self Care | Payer: Commercial Managed Care - PPO | Source: Ambulatory Visit | Attending: Gynecologic Oncology | Admitting: Gynecologic Oncology

## 2021-02-02 DIAGNOSIS — E119 Type 2 diabetes mellitus without complications: Secondary | ICD-10-CM | POA: Insufficient documentation

## 2021-02-02 DIAGNOSIS — D3912 Neoplasm of uncertain behavior of left ovary: Secondary | ICD-10-CM

## 2021-02-02 DIAGNOSIS — I10 Essential (primary) hypertension: Secondary | ICD-10-CM | POA: Diagnosis not present

## 2021-02-02 DIAGNOSIS — C563 Malignant neoplasm of bilateral ovaries: Secondary | ICD-10-CM | POA: Diagnosis present

## 2021-02-02 DIAGNOSIS — C562 Malignant neoplasm of left ovary: Secondary | ICD-10-CM | POA: Diagnosis present

## 2021-02-02 HISTORY — PX: ROBOTIC ASSISTED SALPINGO OOPHERECTOMY: SHX6082

## 2021-02-02 HISTORY — PX: OMENTECTOMY: SHX5985

## 2021-02-02 LAB — TYPE AND SCREEN
ABO/RH(D): B POS
Antibody Screen: NEGATIVE

## 2021-02-02 LAB — GLUCOSE, CAPILLARY
Glucose-Capillary: 204 mg/dL — ABNORMAL HIGH (ref 70–99)
Glucose-Capillary: 211 mg/dL — ABNORMAL HIGH (ref 70–99)

## 2021-02-02 LAB — ABO/RH: ABO/RH(D): B POS

## 2021-02-02 SURGERY — SALPINGO-OOPHORECTOMY, ROBOT-ASSISTED
Anesthesia: General | Laterality: Right

## 2021-02-02 MED ORDER — OXYCODONE HCL 5 MG/5ML PO SOLN: 5.0000 mg | Freq: Once | ORAL | Status: AC | PRN

## 2021-02-02 MED ORDER — LACTATED RINGERS IV SOLN: INTRAVENOUS | Status: DC | PRN

## 2021-02-02 MED ORDER — BUPIVACAINE HCL 0.25 % IJ SOLN
INTRAMUSCULAR | Status: AC
  Filled 2021-02-02: qty 1

## 2021-02-02 MED ORDER — PHENYLEPHRINE HCL (PRESSORS) 10 MG/ML IV SOLN
INTRAVENOUS | Status: AC
  Filled 2021-02-02: qty 1

## 2021-02-02 MED ORDER — MIDAZOLAM HCL 2 MG/2ML IJ SOLN
INTRAMUSCULAR | Status: DC | PRN
  Administered 2021-02-02: 2 mg via INTRAVENOUS

## 2021-02-02 MED ORDER — PHENYLEPHRINE 40 MCG/ML (10ML) SYRINGE FOR IV PUSH (FOR BLOOD PRESSURE SUPPORT)
PREFILLED_SYRINGE | INTRAVENOUS | Status: DC | PRN
  Administered 2021-02-02: 40 ug via INTRAVENOUS

## 2021-02-02 MED ORDER — FENTANYL CITRATE (PF) 100 MCG/2ML IJ SOLN
INTRAMUSCULAR | Status: AC
  Filled 2021-02-02: qty 2

## 2021-02-02 MED ORDER — CHLORHEXIDINE GLUCONATE 0.12 % MT SOLN
15.0000 mL | Freq: Once | OROMUCOSAL | Status: AC
  Administered 2021-02-02: 15 mL via OROMUCOSAL

## 2021-02-02 MED ORDER — OXYCODONE HCL 5 MG PO TABS
ORAL_TABLET | ORAL | Status: AC
  Administered 2021-02-02: 5 mg via ORAL
  Filled 2021-02-02: qty 1

## 2021-02-02 MED ORDER — PROPOFOL 10 MG/ML IV BOLUS
INTRAVENOUS | Status: DC | PRN
  Administered 2021-02-02: 150 mg via INTRAVENOUS
  Administered 2021-02-02: 50 mg via INTRAVENOUS

## 2021-02-02 MED ORDER — HYDROMORPHONE HCL 1 MG/ML IJ SOLN
INTRAMUSCULAR | Status: AC
  Administered 2021-02-02: 0.5 mg via INTRAVENOUS
  Filled 2021-02-02: qty 1

## 2021-02-02 MED ORDER — KETAMINE HCL 10 MG/ML IJ SOLN
INTRAMUSCULAR | Status: DC | PRN
  Administered 2021-02-02: 20 mg via INTRAVENOUS

## 2021-02-02 MED ORDER — FENTANYL CITRATE (PF) 250 MCG/5ML IJ SOLN
INTRAMUSCULAR | Status: DC | PRN
  Administered 2021-02-02: 100 ug via INTRAVENOUS
  Administered 2021-02-02 (×2): 50 ug via INTRAVENOUS

## 2021-02-02 MED ORDER — LACTATED RINGERS IV SOLN: INTRAVENOUS | Status: DC

## 2021-02-02 MED ORDER — EPHEDRINE 5 MG/ML INJ
INTRAVENOUS | Status: AC
  Filled 2021-02-02: qty 10

## 2021-02-02 MED ORDER — MIDAZOLAM HCL 2 MG/2ML IJ SOLN
INTRAMUSCULAR | Status: AC
  Filled 2021-02-02: qty 2

## 2021-02-02 MED ORDER — SUGAMMADEX SODIUM 200 MG/2ML IV SOLN
INTRAVENOUS | Status: DC | PRN
  Administered 2021-02-02: 150 mg via INTRAVENOUS

## 2021-02-02 MED ORDER — ONDANSETRON HCL 4 MG/2ML IJ SOLN
INTRAMUSCULAR | Status: DC | PRN
  Administered 2021-02-02: 4 mg via INTRAVENOUS

## 2021-02-02 MED ORDER — ORAL CARE MOUTH RINSE: 15.0000 mL | Freq: Once | OROMUCOSAL | Status: AC

## 2021-02-02 MED ORDER — ONDANSETRON HCL 4 MG/2ML IJ SOLN
INTRAMUSCULAR | Status: AC
  Filled 2021-02-02: qty 2

## 2021-02-02 MED ORDER — BUPIVACAINE HCL 0.25 % IJ SOLN
INTRAMUSCULAR | Status: DC | PRN
  Administered 2021-02-02: 35 mL

## 2021-02-02 MED ORDER — SODIUM CHLORIDE 0.9% FLUSH: 3.0000 mL | Freq: Two times a day (BID) | INTRAVENOUS | Status: DC

## 2021-02-02 MED ORDER — ACETAMINOPHEN 500 MG PO TABS
1000.0000 mg | ORAL_TABLET | ORAL | Status: AC
  Administered 2021-02-02: 1000 mg via ORAL
  Filled 2021-02-02: qty 2

## 2021-02-02 MED ORDER — LIDOCAINE 20MG/ML (2%) 15 ML SYRINGE OPTIME
INTRAMUSCULAR | Status: DC | PRN
  Administered 2021-02-02: 1.5 mg/kg/h via INTRAVENOUS

## 2021-02-02 MED ORDER — EPHEDRINE SULFATE-NACL 50-0.9 MG/10ML-% IV SOSY
PREFILLED_SYRINGE | INTRAVENOUS | Status: DC | PRN
  Administered 2021-02-02: 10 mg via INTRAVENOUS
  Administered 2021-02-02: 5 mg via INTRAVENOUS
  Administered 2021-02-02: 10 mg via INTRAVENOUS

## 2021-02-02 MED ORDER — OXYCODONE HCL 5 MG PO TABS: 5.0000 mg | ORAL_TABLET | Freq: Once | ORAL | Status: AC | PRN

## 2021-02-02 MED ORDER — LIDOCAINE 2% (20 MG/ML) 5 ML SYRINGE
INTRAMUSCULAR | Status: AC
  Filled 2021-02-02: qty 5

## 2021-02-02 MED ORDER — GABAPENTIN 300 MG PO CAPS
300.0000 mg | ORAL_CAPSULE | ORAL | Status: AC
  Administered 2021-02-02: 300 mg via ORAL
  Filled 2021-02-02: qty 1

## 2021-02-02 MED ORDER — LIDOCAINE HCL 2 % IJ SOLN
INTRAMUSCULAR | Status: AC
  Filled 2021-02-02: qty 20

## 2021-02-02 MED ORDER — MIDAZOLAM HCL 2 MG/2ML IJ SOLN
0.5000 mg | Freq: Once | INTRAMUSCULAR | Status: DC | PRN
Start: 2021-02-02 — End: 2021-02-02

## 2021-02-02 MED ORDER — ROCURONIUM BROMIDE 10 MG/ML (PF) SYRINGE
PREFILLED_SYRINGE | INTRAVENOUS | Status: DC | PRN
  Administered 2021-02-02: 60 mg via INTRAVENOUS

## 2021-02-02 MED ORDER — HYDROMORPHONE HCL 1 MG/ML IJ SOLN
0.2500 mg | INTRAMUSCULAR | Status: DC | PRN
  Administered 2021-02-02 (×2): 0.5 mg via INTRAVENOUS

## 2021-02-02 MED ORDER — MEPERIDINE HCL 50 MG/ML IJ SOLN: 6.2500 mg | INTRAMUSCULAR | Status: DC | PRN

## 2021-02-02 MED ORDER — SCOPOLAMINE 1 MG/3DAYS TD PT72
1.0000 | MEDICATED_PATCH | TRANSDERMAL | Status: DC
  Administered 2021-02-02: 1.5 mg via TRANSDERMAL
  Filled 2021-02-02: qty 1

## 2021-02-02 MED ORDER — ENOXAPARIN SODIUM 40 MG/0.4ML IJ SOSY
40.0000 mg | PREFILLED_SYRINGE | INTRAMUSCULAR | Status: AC
  Administered 2021-02-02: 40 mg via SUBCUTANEOUS
  Filled 2021-02-02: qty 0.4

## 2021-02-02 MED ORDER — LACTATED RINGERS IR SOLN
Status: DC | PRN
  Administered 2021-02-02: 1000 mL

## 2021-02-02 MED ORDER — DEXAMETHASONE SODIUM PHOSPHATE 10 MG/ML IJ SOLN
INTRAMUSCULAR | Status: AC
  Filled 2021-02-02: qty 1

## 2021-02-02 MED ORDER — PHENYLEPHRINE 40 MCG/ML (10ML) SYRINGE FOR IV PUSH (FOR BLOOD PRESSURE SUPPORT)
PREFILLED_SYRINGE | INTRAVENOUS | Status: AC
  Filled 2021-02-02: qty 10

## 2021-02-02 MED ORDER — DEXAMETHASONE SODIUM PHOSPHATE 4 MG/ML IJ SOLN
4.0000 mg | INTRAMUSCULAR | Status: AC
  Administered 2021-02-02: 4 mg via INTRAVENOUS

## 2021-02-02 MED ORDER — PROPOFOL 10 MG/ML IV BOLUS
INTRAVENOUS | Status: AC
  Filled 2021-02-02: qty 40

## 2021-02-02 MED ORDER — CEFAZOLIN SODIUM-DEXTROSE 2-4 GM/100ML-% IV SOLN
2.0000 g | INTRAVENOUS | Status: AC
Start: 2021-02-02 — End: 2021-02-02
  Administered 2021-02-02: 2 g via INTRAVENOUS
  Filled 2021-02-02: qty 100

## 2021-02-02 MED ORDER — KETAMINE HCL 10 MG/ML IJ SOLN
INTRAMUSCULAR | Status: AC
  Filled 2021-02-02: qty 1

## 2021-02-02 MED ORDER — LIDOCAINE 2% (20 MG/ML) 5 ML SYRINGE
INTRAMUSCULAR | Status: DC | PRN
  Administered 2021-02-02: 60 mg via INTRAVENOUS

## 2021-02-02 MED ORDER — ROCURONIUM BROMIDE 10 MG/ML (PF) SYRINGE
PREFILLED_SYRINGE | INTRAVENOUS | Status: AC
  Filled 2021-02-02: qty 10

## 2021-02-02 MED ORDER — METFORMIN HCL 500 MG PO TABS
500.0000 mg | ORAL_TABLET | Freq: Every day | ORAL | Status: DC
  Administered 2021-02-02: 500 mg via ORAL
  Filled 2021-02-02: qty 1

## 2021-02-02 MED ORDER — PROMETHAZINE HCL 25 MG/ML IJ SOLN: 6.2500 mg | INTRAMUSCULAR | Status: DC | PRN

## 2021-02-02 SURGICAL SUPPLY — 72 items
APPLICATOR SURGIFLO ENDO (HEMOSTASIS) IMPLANT
BACTOSHIELD CHG 4% 4OZ (MISCELLANEOUS) ×1
BAG LAPAROSCOPIC 12 15 PORT 16 (BASKET) IMPLANT
BAG RETRIEVAL 12/15 (BASKET)
BLADE SURG SZ10 CARB STEEL (BLADE) IMPLANT
CELLS DAT CNTRL 66122 CELL SVR (MISCELLANEOUS) IMPLANT
COVER BACK TABLE 60X90IN (DRAPES) IMPLANT
COVER TIP SHEARS 8 DVNC (MISCELLANEOUS) ×2 IMPLANT
COVER TIP SHEARS 8MM DA VINCI (MISCELLANEOUS) ×1
COVER WAND RF STERILE (DRAPES) IMPLANT
DECANTER SPIKE VIAL GLASS SM (MISCELLANEOUS) IMPLANT
DERMABOND ADVANCED (GAUZE/BANDAGES/DRESSINGS) ×1
DERMABOND ADVANCED .7 DNX12 (GAUZE/BANDAGES/DRESSINGS) ×2 IMPLANT
DRAPE ARM DVNC X/XI (DISPOSABLE) ×8 IMPLANT
DRAPE COLUMN DVNC XI (DISPOSABLE) ×2 IMPLANT
DRAPE DA VINCI XI ARM (DISPOSABLE) ×4
DRAPE DA VINCI XI COLUMN (DISPOSABLE) ×1
DRAPE SHEET LG 3/4 BI-LAMINATE (DRAPES) ×6 IMPLANT
DRAPE SURG IRRIG POUCH 19X23 (DRAPES) ×3 IMPLANT
DRSG OPSITE POSTOP 4X6 (GAUZE/BANDAGES/DRESSINGS) IMPLANT
DRSG OPSITE POSTOP 4X8 (GAUZE/BANDAGES/DRESSINGS) IMPLANT
ELECT PENCIL ROCKER SW 15FT (MISCELLANEOUS) IMPLANT
ELECT REM PT RETURN 15FT ADLT (MISCELLANEOUS) ×3 IMPLANT
GLOVE SURG ENC MOIS LTX SZ6 (GLOVE) ×12 IMPLANT
GLOVE SURG ENC MOIS LTX SZ6.5 (GLOVE) ×6 IMPLANT
GOWN STRL REUS W/ TWL LRG LVL3 (GOWN DISPOSABLE) ×8 IMPLANT
GOWN STRL REUS W/TWL LRG LVL3 (GOWN DISPOSABLE) ×4
HOLDER FOLEY CATH W/STRAP (MISCELLANEOUS) IMPLANT
IRRIG SUCT STRYKERFLOW 2 WTIP (MISCELLANEOUS) ×3
IRRIGATION SUCT STRKRFLW 2 WTP (MISCELLANEOUS) ×2 IMPLANT
KIT PROCEDURE DA VINCI SI (MISCELLANEOUS)
KIT PROCEDURE DVNC SI (MISCELLANEOUS) IMPLANT
KIT TURNOVER KIT A (KITS) ×3 IMPLANT
MANIPULATOR UTERINE 4.5 ZUMI (MISCELLANEOUS) IMPLANT
NEEDLE HYPO 22GX1.5 SAFETY (NEEDLE) ×3 IMPLANT
NEEDLE SPNL 18GX3.5 QUINCKE PK (NEEDLE) IMPLANT
OBTURATOR OPTICAL STANDARD 8MM (TROCAR) ×1
OBTURATOR OPTICAL STND 8 DVNC (TROCAR) ×2
OBTURATOR OPTICALSTD 8 DVNC (TROCAR) ×2 IMPLANT
PACK ROBOT GYN CUSTOM WL (TRAY / TRAY PROCEDURE) ×3 IMPLANT
PAD POSITIONING PINK XL (MISCELLANEOUS) ×3 IMPLANT
PORT ACCESS TROCAR AIRSEAL 12 (TROCAR) ×2 IMPLANT
PORT ACCESS TROCAR AIRSEAL 5M (TROCAR) ×1
POUCH SPECIMEN RETRIEVAL 10MM (ENDOMECHANICALS) ×6 IMPLANT
RETRACTOR WND ALEXIS 25 LRG (MISCELLANEOUS) IMPLANT
RTRCTR WOUND ALEXIS 18CM MED (MISCELLANEOUS)
RTRCTR WOUND ALEXIS 25CM LRG (MISCELLANEOUS)
SCRUB CHG 4% DYNA-HEX 4OZ (MISCELLANEOUS) ×2 IMPLANT
SEAL CANN UNIV 5-8 DVNC XI (MISCELLANEOUS) ×6 IMPLANT
SEAL XI 5MM-8MM UNIVERSAL (MISCELLANEOUS) ×3
SET TRI-LUMEN FLTR TB AIRSEAL (TUBING) ×3 IMPLANT
SPONGE LAP 18X18 RF (DISPOSABLE) IMPLANT
SURGIFLO W/THROMBIN 8M KIT (HEMOSTASIS) IMPLANT
SUT MNCRL AB 4-0 PS2 18 (SUTURE) IMPLANT
SUT PDS AB 1 TP1 96 (SUTURE) IMPLANT
SUT VIC AB 0 CT1 27 (SUTURE) ×1
SUT VIC AB 0 CT1 27XBRD ANTBC (SUTURE) ×2 IMPLANT
SUT VIC AB 2-0 CT1 27 (SUTURE)
SUT VIC AB 2-0 CT1 TAPERPNT 27 (SUTURE) IMPLANT
SUT VIC AB 4-0 PS2 18 (SUTURE) ×6 IMPLANT
SUT VLOC 180 0 9IN  GS21 (SUTURE) ×1
SUT VLOC 180 0 9IN GS21 (SUTURE) ×2 IMPLANT
SYR 10ML LL (SYRINGE) IMPLANT
SYR 20ML LL LF (SYRINGE) IMPLANT
SYR 50ML LL SCALE MARK (SYRINGE) IMPLANT
TOWEL OR NON WOVEN STRL DISP B (DISPOSABLE) ×3 IMPLANT
TRAP SPECIMEN MUCUS 40CC (MISCELLANEOUS) IMPLANT
TRAY FOLEY MTR SLVR 16FR STAT (SET/KITS/TRAYS/PACK) ×3 IMPLANT
TROCAR XCEL NON-BLD 5MMX100MML (ENDOMECHANICALS) IMPLANT
UNDERPAD 30X36 HEAVY ABSORB (UNDERPADS AND DIAPERS) ×6 IMPLANT
WATER STERILE IRR 1000ML POUR (IV SOLUTION) ×3 IMPLANT
YANKAUER SUCT BULB TIP 10FT TU (MISCELLANEOUS) IMPLANT

## 2021-02-02 NOTE — Anesthesia Postprocedure Evaluation (Signed)
Anesthesia Post Note  Patient: Lisvet Rasheed  Procedure(s) Performed: XI ROBOTIC ASSISTED RIGHT SALPINGO OOPHORECTOMY (Right) ROBOTIC ASSISTED OMENTECTOMY AND PERITONEAL BIOPSIES     Patient location during evaluation: PACU Anesthesia Type: General Level of consciousness: awake and alert, patient cooperative and oriented Pain management: pain level controlled Vital Signs Assessment: post-procedure vital signs reviewed and stable Respiratory status: spontaneous breathing, nonlabored ventilation and respiratory function stable Cardiovascular status: blood pressure returned to baseline and stable Postop Assessment: no apparent nausea or vomiting and adequate PO intake Anesthetic complications: no   No notable events documented.  Last Vitals:  Vitals:   02/02/21 1015 02/02/21 1030  BP: (!) 142/70 (!) 145/80  Pulse: 94 82  Resp: 18 18  Temp: (!) 36.1 C (!) 36.1 C  SpO2: 99% 100%    Last Pain:  Vitals:   02/02/21 1030  TempSrc:   PainSc: 0-No pain                 Amitai Delaughter,E. Sorin Frimpong

## 2021-02-02 NOTE — Transfer of Care (Signed)
Immediate Anesthesia Transfer of Care Note  Patient: Beth Freeman  Procedure(s) Performed: XI ROBOTIC ASSISTED RIGHT SALPINGO OOPHORECTOMY (Right) ROBOTIC ASSISTED OMENTECTOMY AND PERITONEAL BIOPSIES  Patient Location: PACU  Anesthesia Type:General  Level of Consciousness: drowsy and patient cooperative  Airway & Oxygen Therapy: Patient Spontanous Breathing and Patient connected to face mask oxygen  Post-op Assessment: Report given to RN and Post -op Vital signs reviewed and stable  Post vital signs: Reviewed and stable  Last Vitals:  Vitals Value Taken Time  BP 151/76 02/02/21 0903  Temp    Pulse 95 02/02/21 0905  Resp 25 02/02/21 0905  SpO2 100 % 02/02/21 0905  Vitals shown include unvalidated device data.  Last Pain:  Vitals:   02/02/21 0538  TempSrc: Oral         Complications: No notable events documented.

## 2021-02-02 NOTE — Progress Notes (Signed)
Left message on patient's contact (husband, Legrand Como) phone that the procedure went well with no complications. Right tube and ovary and omentum removed. The procedure was uncomplicated. She should be ready for discharge later this morning.  Thereasa Solo, MD

## 2021-02-02 NOTE — Interval H&P Note (Signed)
History and Physical Interval Note:  02/02/2021 7:05 AM  Beth Freeman  has presented today for surgery, with the diagnosis of LEFT OVARIAN CANCER.  The various methods of treatment have been discussed with the patient and family. After consideration of risks, benefits and other options for treatment, the patient has consented to  Procedure(s): XI ROBOTIC ASSISTED RIGHT SALPINGO OOPHORECTOMY (Right) ROBOTIC ASSISTED OMENTECTOMY AND STAGING WITH BIOPSIES (N/A) as a surgical intervention.  The patient's history has been reviewed, patient examined, no change in status, stable for surgery.  I have reviewed the patient's chart and labs.  Questions were answered to the patient's satisfaction.     Thereasa Solo

## 2021-02-02 NOTE — Op Note (Addendum)
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  Assistant Surgeon: Lahoma Crocker M.D. (an MD assistant was necessary for tissue manipulation, management of robotic instrumentation, retraction and positioning due to the complexity of the case and hospital policies).   Anesthesia: Gen. endotracheal.  Specimens: right ovary and fallopian tube, omentum, anterior pelvic peritoneum, right and left pelvic peritoneum, right and left abdominal peritoneal biopsies, pelvic washings  Estimated Blood Loss: 10 mL. Blood Replacement: None  Complications: none  Indication for Procedure:  the patient had a history of a left salpingo-oophorectomy for granulosa cell tumor (found incidentally on final pathology). She required definitive staging procedure.  Operative Findings: residual right tube and ovary adherent to the bladder and vagina. No gross extraovarian granulosa cell tumor. No ascites, normal appearing omentum, and upper abdomen and diaphragms.  Procedure: The patient's taken to the operating room and placed under general endotracheal anesthesia testing difficulty. She is placed in a dorsolithotomy position and cervical acromial pad was placed. The arms were tucked with care taken to pad the olecranon process. And prepped and draped in usual sterile fashion. A uterine manipulator (zumi) was placed vaginally. A 77mm incision was made in the left upper quadrant palmer's point and a 5 mm Optiview trocar used to enter the abdomen under direct visualization. With entry into the abdomen and then maintenance of 15 mm of mercury the patient was placed in Trendelenburg position. An incision was made in the umbilicus and an 27mm trochar was placed through this site. Two  incisions were made lateral to the umbilical incision in the left and right abdomen measuring 61mm. These incisions were made approximately 10 cm lateral to the umbilical incision. 8 mm robotic trochars were inserted. The robot was docked.  The abdomen was inspected as was the pelvis.  Pelvic washings were obtained. An incision was made on the right pelvic side wall peritoneum parallel to the IP ligament and the retroperitoneal space entered. The right ureter was identified and the para-rectal space was developed. A window was created in the right broad ligament above the ureter. The right infundibulopelvic vessels were skeletonized cauterized and transected. The ovarian attachments to the vagina and bladder were similarly were cauterized and transected. Specimen was placed in an Endo Catch bag.  Peritoneal biopsies were then collected from the anterior pelvic peritoneum, right and left pelvic peritoneum, and right and left abdominal peritoneum.   The omentectomy was then performed. The patient was taken out of trendelenberg and the robotic camera was turned to face the upper abdomen. The omentum was elevated anteriorally. The parietal peritonum that attaches the omentum to the transverse colon was incised facilitating separation of the mid portion of the omentum from the transverse colon. The right sided omentum with its vascular pedicles was then skeletonized in its attachments to the right transverse colon. These vascular pedicles were bipolar sealed and transected. Observation of the colonic wall occurred throughout. The dissection then moved progressively along the colon the the left splenic flexure of the transverse colon. The bipolar sealing forceps and the scissors were used to skeletonize vascular omental pedicles, seal them and transect them until the entire infracolic omentum had been freed from its transverse colonic attachments to the splenic flexure. The omentum was then placed in an endocatch bag  and retrieved from the left upper quadrant port.  The robot was undocked. The right ovarian specimen within the Endo Catch bag was removed from the abdominal cavity.  The ports were all remove. The fascial closure at the umbilical incision and left upper quadrant port was made with 0 Vicryl.  All incisions were closed with a running subcuticular Monocryl suture. Dermabond was applied. Sponge, lap and needle counts were correct x 3.    The patient had sequential compression devices for VTE prophylaxis.         Disposition: PACU          Condition: stable  Donaciano Eva, MD

## 2021-02-02 NOTE — Discharge Instructions (Signed)
Return to work: 4 weeks (2 weeks with physical restrictions).  Activity: 1. Be up and out of the bed during the day.  Take a nap if needed.  You may walk up steps but be careful and use the hand rail.  Stair climbing will tire you more than you think, you may need to stop part way and rest.   2. No lifting or straining for 4 weeks.  3. No driving for 1 weeks.  Do Not drive if you are taking narcotic pain medicine.  4. Shower daily.  Use soap and water on your incision and pat dry; don't rub.   5. No sexual activity and nothing in the vagina for 8 weeks.  Medications:  - Take ibuprofen and tylenol first line for pain control. Take these regularly (every 6 hours) to decrease the build up of pain.  - If necessary, for severe pain not relieved by ibuprofen, contact Dr Serita Grit office and you will be prescribed percocet.  - While taking percocet you should take sennakot every night to reduce the likelihood of constipation. If this causes diarrhea, stop its use.  Diet: 1. Low sodium Heart Healthy Diet is recommended.  2. It is safe to use a laxative if you have difficulty moving your bowels.   Wound Care: 1. Keep clean and dry.  Shower daily.  Reasons to call the Doctor:  Fever - Oral temperature greater than 100.4 degrees Fahrenheit Foul-smelling vaginal discharge Difficulty urinating Nausea and vomiting Increased pain at the site of the incision that is unrelieved with pain medicine. Difficulty breathing with or without chest pain New calf pain especially if only on one side Sudden, continuing increased vaginal bleeding with or without clots.   Follow-up: 1. See Everitt Amber in 4 weeks.  Contacts: For questions or concerns you should contact:  Dr. Everitt Amber at [phone] After hours and on week-ends call [phone] and ask to speak to the physician on call for Gynecologic Oncology

## 2021-02-02 NOTE — Anesthesia Procedure Notes (Addendum)
Procedure Name: Intubation Date/Time: 02/02/2021 7:35 AM Performed by: Eben Burow, CRNA Pre-anesthesia Checklist: Patient identified, Emergency Drugs available, Suction available, Patient being monitored and Timeout performed Patient Re-evaluated:Patient Re-evaluated prior to induction Oxygen Delivery Method: Circle system utilized Preoxygenation: Pre-oxygenation with 100% oxygen Induction Type: IV induction Ventilation: Mask ventilation without difficulty Laryngoscope Size: Mac and 4 Grade View: Grade I Tube type: Oral Number of attempts: 1 Airway Equipment and Method: Stylet Placement Confirmation: ETT inserted through vocal cords under direct vision, positive ETCO2 and breath sounds checked- equal and bilateral Secured at: 22 cm Tube secured with: Tape Dental Injury: Teeth and Oropharynx as per pre-operative assessment  Comments: DVL x1 and intubation by [place] paramedic student, supervised by Dr Glennon Mac.

## 2021-02-03 ENCOUNTER — Telehealth: Payer: Self-pay

## 2021-02-03 ENCOUNTER — Encounter (HOSPITAL_COMMUNITY): Payer: Self-pay | Admitting: Gynecologic Oncology

## 2021-02-03 LAB — INHIBIN B: Inhibin B: <7 pg/mL (ref 0.0–16.9)

## 2021-02-03 LAB — SURGICAL PATHOLOGY

## 2021-02-03 LAB — CYTOLOGY - NON PAP

## 2021-02-03 NOTE — Telephone Encounter (Signed)
Spoke with Beth Freeman this morning. She states she is eating, drinking and urinating well. She has not had a BM yet but is passing gas. She has not taken senokot, the pharmacy instructed the patient it is over the counter. Encouraged her to pick up senokot to help prevent constipation after surgery and to drink plenty of fluids. She denies fever or chills. Incisions are dry and intact. Her pain is controlled with ibuprofen.  Instructed to call office with any fever, chills, purulent drainage, uncontrolled pain or any other questions or concerns. Patient verbalizes understanding.   Pt aware of post op appointments as well as the office number [phone] and after hours number [phone] to call if she has any questions or concerns

## 2021-02-04 ENCOUNTER — Telehealth: Payer: Self-pay

## 2021-02-04 NOTE — Telephone Encounter (Signed)
Spoke with Beth Freeman and let her know that all biopsies, omentum, right ovary and fallopian tube and washings were all negative for cancer. Patient very cheerful and verbalized understanding. Will call if she has any questions or concerns. She is aware of her post-op appointment on 7/5.

## 2021-02-18 ENCOUNTER — Encounter: Payer: Self-pay | Admitting: Gynecologic Oncology

## 2021-02-23 ENCOUNTER — Inpatient Hospital Stay: Payer: Commercial Managed Care - PPO | Attending: Gynecologic Oncology | Admitting: Gynecologic Oncology

## 2021-02-23 ENCOUNTER — Other Ambulatory Visit: Payer: Self-pay

## 2021-02-23 VITALS — BP 143/85 | HR 100 | Temp 97.0°F | Resp 18 | Ht 64.0 in | Wt 152.2 lb

## 2021-02-23 DIAGNOSIS — Z9079 Acquired absence of other genital organ(s): Secondary | ICD-10-CM | POA: Diagnosis not present

## 2021-02-23 DIAGNOSIS — Z7189 Other specified counseling: Secondary | ICD-10-CM | POA: Diagnosis not present

## 2021-02-23 DIAGNOSIS — C562 Malignant neoplasm of left ovary: Secondary | ICD-10-CM | POA: Insufficient documentation

## 2021-02-23 DIAGNOSIS — Z90722 Acquired absence of ovaries, bilateral: Secondary | ICD-10-CM | POA: Diagnosis not present

## 2021-02-23 NOTE — Patient Instructions (Signed)
Dr Denman George recommends guafenacin for your cough (it is over the counter).  Dr Denman George recommends returning at 6 monthly intervals for 10 years for your cancer follow-up visits.  Dr Denman George is departing the [place] at [facility] in October, 2022. Her partners and colleagues including Dr Berline Lopes, Dr Delsa Sale and Joylene John, Nurse Practitioner will be available to continue your care.   You are next scheduled to return to the Gynecologic Oncology office at the [facility] in January, 2023. Please call [phone] in October to request an appointment for January with Dr Serita Grit partner, Dr Berline Lopes.

## 2021-02-23 NOTE — Progress Notes (Signed)
Follow-up Note: Gyn-Onc  Consult was requested by Dr. Addison Bailey for the evaluation of Beth Freeman 55 y.o. female  CC:  Chief Complaint  Patient presents with   Granulosa cell carcinoma of ovary, left [facility])    Assessment/Plan:  [name]  is a 55 y.o.  year old with clinical stage IC granulosa cell tumor of the left ovary s/p definitive staging on 02/02/21.  I am recommending follow-up at 6 monthly intervals with pelvic exam, symptom review and assessment of tumor markers (Inhibin B and AMH).  I counseled her about prognosis and risk for recurrence.  She will follow-up with my partner, Dr Berline Lopes in 6 months.    HPI: [name] is a 55 year old woman who was seen in consultation at the request of Dr Addison Bailey for evaluation of a left ovarian granulosa cell tumor found incidentally at the time of an LSO (and right ovarian cystectomy) for bilateral ovarian cysts on 12/21/20.  The patient has a remote history of a hysterectomy for benign indications approximately 15 years prior to diagnosis.  She reported having an episode of severe dyspareunia on 12/06/2020.  This sent the patient to the emergency room in [facility] where a CT scan of the abdomen and pelvis was performed which demonstrated bilateral heterogeneously enhancing adnexal masses likely ovarian in origin.  The right-sided mass measured 7.6 cm and the left-sided mass measured 6.8 cm.  She sought evaluation by her gynecologist who performed a Ca1 125 which was normal at 9.2, normal CEA at 2.3, normal HD4.  They proceeded with planned surgery with either BSO or USO and ovarian cystectomy.  This surgical procedure took place on 12/21/2020 with Dr. Addison Bailey and began as a laparoscopy.  Intraoperative findings were significant for large bilateral ovarian cysts.  A decision was made to convert to laparotomy via her Pfannenstiel incision. A left salpingo-oophorectomy and right ovarian cystectomy was performed. The  operative report does not mention evidence of extraovarian disease.  Based on the operative report the ovarian masses were removed intact that this is not clearly stipulated in the note.  Final pathology from the procedure revealed a left ovary containing adult granulosa cell tumor that was 8.5 cm there were no lymph nodes, washings or peritoneal biopsies for staging purposes.  The surface of the ovary did not demonstrate obvious surface involvement however the left ovary was ruptured focally.  The right ovarian cyst that was removed revealed inflammation and granulation tissue without granulosa cell tumor.  The right ovary cyst was ruptured at the time was received by pathology.  Based on the apparent rupture of the left ovarian granulosa cell tumor, and empiric stage of stage Ic was assigned.  Interval Hx:  On 02/02/21 she underwent robotic assisted right salpingo-oophorectomy, omentectomy, peritoneal biopsies. Intraoperative findings were significant for no gross residual extraovarian granulosa cell tumor.  No ascites.  Normal-appearing upper abdomen. Surgery was uncomplicated.  Final pathology revealed no residual carcinoma or in any of the biopsies, omentum, right tube and ovary, or pelvic washings.  Since surgery she has done well with no concerning symptoms.   Current Meds:  Outpatient Encounter Medications as of 02/23/2021  Medication Sig   chlorthalidone (HYGROTON) 25 MG tablet Take 25 mg by mouth daily.   ferrous sulfate 325 (65 FE) MG tablet Take 325 mg by mouth 2 (two) times a week.   metFORMIN (GLUCOPHAGE) 500 MG tablet Take 500 mg by mouth daily with breakfast.   Multiple Vitamin (MULTIVITAMIN) capsule  Take 2 capsules by mouth every other day.   oxyCODONE (OXY IR/ROXICODONE) 5 MG immediate release tablet Take 1 tablet (5 mg total) by mouth every 4 (four) hours as needed for severe pain. For AFTER surgery only, do not take and drive   Potassium 99 MG TABS Take 99 mg by mouth 2 (two)  times a week.   vitamin B-12 (CYANOCOBALAMIN) 500 MCG tablet Take 1,000 mcg by mouth 2 (two) times a week.   [DISCONTINUED] senna-docusate (SENOKOT-S) 8.6-50 MG tablet Take 2 tablets by mouth at bedtime. For AFTER surgery, do not take if having diarrhea (Patient not taking: Reported on 02/18/2021)   No facility-administered encounter medications on file as of 02/23/2021.    Allergy:  Allergies  Allergen Reactions   Ace Inhibitors     Other reaction(s): Cough Cough Cough     Social Hx:   Social History   Socioeconomic History   Marital status: Married    Spouse name: Micheal   Number of children: 2   Years of education: Not on file   Highest education level: Not on file  Occupational History   Not on file  Tobacco Use   Smoking status: Never   Smokeless tobacco: Never  Vaping Use   Vaping Use: Never used  Substance and Sexual Activity   Alcohol use: No   Drug use: No   Sexual activity: Not Currently    Birth control/protection: Surgical  Other Topics Concern   Not on file  Social History Narrative   Not on file   Social Determinants of Health   Financial Resource Strain: Not on file  Food Insecurity: Not on file  Transportation Needs: Not on file  Physical Activity: Not on file  Stress: Not on file  Social Connections: Not on file  Intimate Partner Violence: Not on file    Past Surgical Hx:  Past Surgical History:  Procedure Laterality Date   ABDOMINAL HYSTERECTOMY     COLONOSCOPY  2018   left salpingo-oophorectomy Left 12/21/2020   OMENTECTOMY N/A 02/02/2021   Procedure: ROBOTIC ASSISTED OMENTECTOMY AND PERITONEAL BIOPSIES;  Surgeon: Everitt Amber, MD;  Location: [facility];  Service: Gynecology;  Laterality: N/A;   ROBOTIC ASSISTED SALPINGO OOPHERECTOMY Right 02/02/2021   Procedure: XI ROBOTIC ASSISTED RIGHT SALPINGO OOPHORECTOMY;  Surgeon: Everitt Amber, MD;  Location: [facility];  Service: Gynecology;  Laterality: Right;    Past Medical Hx:  Past Medical History:   Diagnosis Date   Abnormal abdominal CT scan    ? adnexal lesion   Diverticulitis    hosp 10/2014.    Diverticulosis    GERD (gastroesophageal reflux disease)    Hypertension    Type II diabetes mellitus ([place])     Past Gynecological History:  No LMP recorded. Patient has had a hysterectomy.  Family Hx:  Family History  Problem Relation Age of Onset   Breast cancer Mother    Colon cancer Neg Hx    Ovarian cancer Neg Hx    Endometrial cancer Neg Hx    Pancreatic cancer Neg Hx    Prostate cancer Neg Hx     Review of Systems:  Constitutional  Feels well,  See HPI  ENT Normal appearing ears and nares bilaterally Skin/Breast  No rash, sores, jaundice, itching, dryness Cardiovascular  No chest pain, shortness of breath, or edema  Pulmonary  No cough or wheeze.  Gastro Intestinal  No nausea, vomitting, or diarrhoea. No bright red blood per rectum, no abdominal pain, change in bowel  movement, or constipation.  Genito Urinary  No frequency, urgency, dysuria,  Musculo Skeletal  No myalgia, arthralgia, joint swelling or pain  Neurologic  No weakness, numbness, change in gait,  Psychology  No depression, anxiety, insomnia.   Vitals:  Blood pressure (!) 143/85, pulse 100, temperature (!) 97 F (36.1 C), temperature source Tympanic, resp. rate 18, height 5\' 4"  (1.626 m), weight 152 lb 3.2 oz (69 kg), SpO2 100 %.  Physical Exam: WD in NAD Neck  Supple NROM, without any enlargements.  Lymph Node Survey No cervical supraclavicular or inguinal adenopathy Cardiovascular  Well perfused peripheries Lungs  No increased WOB Skin  No rash/lesions/breakdown  Psychiatry  Alert and oriented to person, place, and time  Abdomen  Normoactive bowel sounds, abdomen soft, non-tender and nonobese without evidence of hernia. Incisions healing normally.   Back No CVA tenderness Genito Urinary  Vulva/vagina: deferred Rectal  deferred Extremities  No bilateral cyanosis, clubbing or  edema.   30 minutes of direct face to face counseling time was spent with the patient. This included discussion about prognosis, therapy recommendations and postoperative side effects and are beyond the scope of routine postoperative care.    Thereasa Solo, MD  02/23/2021, 5:20 PM

## 2021-08-24 ENCOUNTER — Telehealth: Payer: Self-pay

## 2021-08-24 NOTE — Telephone Encounter (Signed)
Received call from pt needing to schedule her 6 month follow up appt. Appt scheduled for 09/16/2021 at 1:00 pm. Pt advised to arrive by 12:45 pm to check in. Patient verbalized understanding and agreeable to date and time of appt.

## 2021-09-14 ENCOUNTER — Encounter: Payer: Self-pay | Admitting: Gynecologic Oncology

## 2021-09-15 NOTE — Progress Notes (Signed)
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   The patient has a remote history of a hysterectomy for benign indications approximately 15 years prior to diagnosis.  She reported having an episode of severe dyspareunia on 12/06/2020.  This sent the patient to the emergency room in [facility] where a CT scan of the abdomen and pelvis was performed which demonstrated bilateral heterogeneously enhancing adnexal masses likely ovarian in origin.  The right-sided mass measured 7.6 cm and the left-sided mass measured 6.8 cm.   She sought evaluation by her gynecologist who performed a Ca1 125 which was normal at 9.2, normal CEA at 2.3, normal HD4.   They proceeded with planned surgery with either BSO or USO and ovarian cystectomy.   This surgical procedure took place on 12/21/2020 with Dr. Addison Bailey and began as a laparoscopy.  Intraoperative findings were significant for large bilateral ovarian cysts.  A decision was made to convert to laparotomy via her Pfannenstiel incision. A left salpingo-oophorectomy and right ovarian cystectomy was performed. The operative report does not mention evidence of extraovarian disease.  Based on the operative report the ovarian masses were removed intact that this is not clearly stipulated in the note.   Final pathology from the procedure revealed a left ovary containing adult granulosa cell tumor that was 8.5 cm there were no lymph nodes, washings or peritoneal biopsies for staging purposes.  The surface of the ovary did not demonstrate obvious surface involvement however the left ovary was ruptured focally.   The right ovarian cyst that was removed revealed inflammation and granulation tissue without granulosa cell tumor.  The right ovary cyst was ruptured at the time was received by pathology.   Based on the apparent rupture of the left ovarian granulosa cell tumor, and empiric stage of stage Ic was assigned.   On 02/02/21 she underwent robotic assisted right salpingo-oophorectomy, omentectomy, peritoneal biopsies. Intraoperative findings were significant for no gross residual extraovarian granulosa cell tumor.  No ascites.  Normal-appearing upper abdomen. Surgery was uncomplicated.  Final pathology revealed no residual carcinoma or in any of the biopsies, omentum, right tube and ovary, or pelvic washings.  Interval History: Patient reports overall doing well.  She denies any vaginal bleeding or discharge.  Endorses a good appetite without nausea or emesis.  Reports normal bowel and bladder function.  Has some abdominal pain at the site of one of her incisions but only with heavy lifting.  Continues to have hot flashes as well as decreased libido.  Past Medical/Surgical History: Past Medical History:  Diagnosis Date   Abnormal abdominal CT scan    ? adnexal lesion   Diverticulitis    hosp 10/2014.    Diverticulosis    GERD (gastroesophageal reflux disease)    Hypertension    Type II diabetes mellitus ([employer])     Past Surgical History:  Procedure Laterality Date   ABDOMINAL HYSTERECTOMY     COLONOSCOPY  2018   left salpingo-oophorectomy Left 12/21/2020   OMENTECTOMY N/A 02/02/2021   Procedure: ROBOTIC ASSISTED OMENTECTOMY AND PERITONEAL BIOPSIES;  Surgeon: Everitt Amber, MD;  Location: [facility];  Service: Gynecology;  Laterality:  N/A;   ROBOTIC ASSISTED SALPINGO OOPHERECTOMY Right 02/02/2021   Procedure: XI ROBOTIC ASSISTED RIGHT SALPINGO OOPHORECTOMY;  Surgeon: Everitt Amber, MD;  Location: [facility];  Service: Gynecology;  Laterality: Right;    Family History  Problem Relation Age of Onset    Breast cancer Mother    Colon cancer Neg Hx    Ovarian cancer Neg Hx    Endometrial cancer Neg Hx    Pancreatic cancer Neg Hx    Prostate cancer Neg Hx     Social History   Socioeconomic History   Marital status: Married    Spouse name: Beth Freeman   Number of children: 2   Years of education: Not on file   Highest education level: Not on file  Occupational History   Not on file  Tobacco Use   Smoking status: Never   Smokeless tobacco: Never  Vaping Use   Vaping Use: Never used  Substance and Sexual Activity   Alcohol use: No   Drug use: No   Sexual activity: Not Currently    Birth control/protection: Surgical  Other Topics Concern   Not on file  Social History Narrative   Not on file   Social Determinants of Health   Financial Resource Strain: Not on file  Food Insecurity: Not on file  Transportation Needs: Not on file  Physical Activity: Not on file  Stress: Not on file  Social Connections: Not on file    Current Medications:  Current Outpatient Medications:    chlorthalidone (HYGROTON) 25 MG tablet, Take 25 mg by mouth daily., Disp: , Rfl:    empagliflozin (JARDIANCE) 25 MG TABS tablet, Take 1 tablet by mouth daily., Disp: , Rfl:    metFORMIN (GLUCOPHAGE) 500 MG tablet, Take 500 mg by mouth daily with breakfast., Disp: , Rfl:    Multiple Vitamin (MULTIVITAMIN) capsule, Take 2 capsules by mouth every other day., Disp: , Rfl:    ferrous sulfate 325 (65 FE) MG tablet, Take 325 mg by mouth 2 (two) times a week. (Patient not taking: Reported on 09/14/2021), Disp: , Rfl:    oxyCODONE (OXY IR/ROXICODONE) 5 MG immediate release tablet, Take 1 tablet (5 mg total) by mouth every 4 (four) hours as needed for severe pain. For AFTER surgery only, do not take and drive (Patient not taking: Reported on 09/14/2021), Disp: 15 tablet, Rfl: 0   Potassium 99 MG TABS, Take 99 mg by mouth 2 (two) times a week. (Patient not taking: Reported on 09/14/2021), Disp: , Rfl:    vitamin B-12  (CYANOCOBALAMIN) 500 MCG tablet, Take 1,000 mcg by mouth 2 (two) times a week. (Patient not taking: Reported on 09/14/2021), Disp: , Rfl:   Review of Systems: Pertinent positives as per HPI. Denies appetite changes, fevers, chills, fatigue, unexplained weight changes. Denies hearing loss, neck lumps or masses, mouth sores, ringing in ears or voice changes. Denies cough or wheezing.  Denies shortness of breath. Denies chest pain or palpitations. Denies leg swelling. Denies abdominal distention, blood in stools, constipation, diarrhea, nausea, vomiting, or early satiety. Denies pain with intercourse, dysuria, frequency, hematuria or incontinence. Denies pelvic pain, vaginal bleeding or vaginal discharge.   Denies joint pain, back pain or muscle pain/cramps. Denies itching, rash, or wounds. Denies dizziness, headaches, numbness or seizures. Denies swollen lymph nodes or glands, denies easy bruising or bleeding. Denies anxiety, depression, confusion, or decreased concentration.  Physical Exam: BP 133/81 (BP Location: Left Arm, Patient Position: Sitting)    Pulse 93    Temp 97.7  F (36.5 C) (Oral)    Resp 18    Wt 158 lb 8 oz (71.9 kg)    SpO2 100%    BMI 27.21 kg/m  General: Alert, oriented, no acute distress. HEENT: Normocephalic, atraumatic, sclera anicteric. Chest: Clear to auscultation bilaterally.  No wheezes or rhonchi. Cardiovascular: Regular rate and rhythm, no murmurs. Abdomen: Obese, soft, nontender.  Normoactive bowel sounds.  No masses or hepatosplenomegaly appreciated.  Well-healed laparoscopic incisions. Extremities: Grossly normal range of motion.  Warm, well perfused.  No edema bilaterally. Skin: No rashes or lesions noted. Lymphatics: No cervical, supraclavicular, or inguinal adenopathy. GU: Normal appearing external genitalia without erythema, excoriation, or lesions.  Speculum exam reveals mildly atrophic, no lesions or masses.  Cuff intact.  Bimanual exam reveals cuff  intact, no nodularity or masses.  Rectovaginal exam confirms these findings.  Laboratory & Radiologic Studies: None new  Assessment & Plan: Rhyan Wolters is a 56 y.o. 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  The patient is overall doing well today and is NED.  We discussed continued follow-up with visits every 6 months for a pelvic exam, symptom review and assessment of tumor markers (Inhibin B and AMH).  These both have been ordered for today.  I will call her with the results once available.  We discussed again today risk of recurrence and need for long-term follow-up.  Discussed options for hot flashes as well as libido.  The patient's preference is to avoid prescription medications or hormone replacement therapy.  She previously tried black cohosh but only for about a week.  We discussed the use of supplements.  28 minutes of total time was spent for this patient encounter, including preparation, face-to-face counseling with the patient and coordination of care, and documentation of the encounter.  Jeral Pinch, MD  Division of Gynecologic Oncology  Department of Obstetrics and Gynecology  [facility]

## 2021-09-16 ENCOUNTER — Encounter: Payer: Self-pay | Admitting: Gynecologic Oncology

## 2021-09-16 ENCOUNTER — Inpatient Hospital Stay: Payer: Commercial Managed Care - PPO | Attending: Gynecologic Oncology | Admitting: Gynecologic Oncology

## 2021-09-16 ENCOUNTER — Other Ambulatory Visit: Payer: Self-pay

## 2021-09-16 ENCOUNTER — Inpatient Hospital Stay: Payer: Commercial Managed Care - PPO

## 2021-09-16 VITALS — BP 133/81 | HR 93 | Temp 97.7°F | Resp 18 | Wt 158.5 lb

## 2021-09-16 DIAGNOSIS — Z90722 Acquired absence of ovaries, bilateral: Secondary | ICD-10-CM | POA: Diagnosis not present

## 2021-09-16 DIAGNOSIS — Z9079 Acquired absence of other genital organ(s): Secondary | ICD-10-CM | POA: Insufficient documentation

## 2021-09-16 DIAGNOSIS — E119 Type 2 diabetes mellitus without complications: Secondary | ICD-10-CM | POA: Diagnosis not present

## 2021-09-16 DIAGNOSIS — Z79899 Other long term (current) drug therapy: Secondary | ICD-10-CM | POA: Insufficient documentation

## 2021-09-16 DIAGNOSIS — K219 Gastro-esophageal reflux disease without esophagitis: Secondary | ICD-10-CM | POA: Diagnosis not present

## 2021-09-16 DIAGNOSIS — I10 Essential (primary) hypertension: Secondary | ICD-10-CM | POA: Diagnosis not present

## 2021-09-16 DIAGNOSIS — N951 Menopausal and female climacteric states: Secondary | ICD-10-CM | POA: Diagnosis not present

## 2021-09-16 DIAGNOSIS — Z90721 Acquired absence of ovaries, unilateral: Secondary | ICD-10-CM | POA: Diagnosis not present

## 2021-09-16 DIAGNOSIS — C562 Malignant neoplasm of left ovary: Secondary | ICD-10-CM

## 2021-09-16 DIAGNOSIS — Z7984 Long term (current) use of oral hypoglycemic drugs: Secondary | ICD-10-CM | POA: Diagnosis not present

## 2021-09-16 NOTE — Patient Instructions (Signed)
It was nice to meet you today.  I do not see or feel any evidence of cancer recurrence on your exam.  I will have the office call you when we get your lab results back, likely next week or the week after.  We will continue with visits every 6 months.  If you develop symptoms between visits, please call to see me sooner.  Otherwise, please call back in late May or early June to get a visit scheduled with me in late July.

## 2021-09-17 LAB — INHIBIN B: Inhibin B: <7 pg/mL

## 2021-09-21 ENCOUNTER — Telehealth: Payer: Self-pay

## 2021-09-21 NOTE — Telephone Encounter (Signed)
Spoke with [name] this afternoon and reviewed Inhibin B lab results (Normal). An AMH test was also ordered but was not drawn at the same time as the inhibin B. Patient will need to come back for another lab appointment to have this completed. Patient verbalized understanding. Lab appointment scheduled for 09/22/21 at 11 am. Patient in agreement of appointment date and time.

## 2021-09-21 NOTE — Addendum Note (Signed)
Addended by: Joylene John D on: 09/21/2021 01:34 PM   Modules accepted: Orders

## 2021-09-22 ENCOUNTER — Inpatient Hospital Stay: Payer: Commercial Managed Care - PPO

## 2021-09-23 ENCOUNTER — Other Ambulatory Visit: Payer: Self-pay

## 2021-09-23 ENCOUNTER — Inpatient Hospital Stay: Payer: Commercial Managed Care - PPO | Attending: Gynecologic Oncology

## 2021-09-23 DIAGNOSIS — C562 Malignant neoplasm of left ovary: Secondary | ICD-10-CM | POA: Diagnosis present

## 2021-09-27 ENCOUNTER — Telehealth: Payer: Self-pay

## 2021-09-27 NOTE — Telephone Encounter (Signed)
Received call from [name], she is inquiring about her [facility] lab results. Results are not back yet, advised patient that our office will call her with the results.  Patient states "Can you call me something into the pharmacy to help with sex?  I just don't want sex anymore and I want something to help. I still want to avoid anything with estrogen."  Advised patient that Dr. Berline Lopes will be notified and someone from our office will follow up with her.

## 2021-09-28 ENCOUNTER — Telehealth: Payer: Self-pay | Admitting: *Deleted

## 2021-09-28 NOTE — Telephone Encounter (Signed)
Patient called requesting her [facility] lab results. Explained that the lab results can take up to a week to results.   Patient stated I talked to [facility] yesterday regarding my sex drive and pills I haven't heard back from her. Explained that Dr Berline Lopes was in the OR all day toady and tomorrow. Explained that the office will call her back once we have heard from Dr Berline Lopes

## 2021-09-29 NOTE — Telephone Encounter (Signed)
Attempted to follow up with patient. Unable to contact her, left message requesting return call.

## 2021-09-29 NOTE — Telephone Encounter (Signed)
Spoke with [name] this afternoon. Per Dr. Berline Lopes estrogen and testosterone are the most commonly used (or both). There are non hormonal options. Patient expressed interest in non-hormonal options. Telephone visit scheduled for 10/01/21 at 4:15 pm with Dr. Berline Lopes to discuss options.  Patient states "My husband is going out of town for a month and leaves on 10/04/21. If something can't be done before then its no bother."  Patient is in agreement of appointment date and time. She states she will be traveling to [place] and may not have the best reception but to please call again if unable to get through.

## 2021-09-30 ENCOUNTER — Telehealth: Payer: Self-pay | Admitting: Gynecologic Oncology

## 2021-09-30 LAB — ANTI MULLERIAN HORMONE: ANTI-MULLERIAN HORMONE (AMH): <0.015 ng/mL

## 2021-09-30 NOTE — Telephone Encounter (Signed)
I called the patient to discuss decreased libido.  She and I had previously talked about this at her in person visit.  We discussed more commonly used therapy such as estrogen or testosterone.  The patient is very much wanting to avoid any hormonal therapy such as testosterone.  We also discussed pharmacologic interventions as well as nonpharmacologic interventions.  At this time, the patient is very much wanting to avoid taking any medications.  I encouraged her to reach out to me if her decreased libido is continuing to impact her quality of life and she decides that she would like to try medication treatment.  She voiced being amenable to this plan.  She was happy to hear that [facility] has also resulted as normal.  Jeral Pinch MD Gynecologic Oncology

## 2021-10-01 ENCOUNTER — Inpatient Hospital Stay: Payer: Commercial Managed Care - PPO | Admitting: Gynecologic Oncology

## 2024-06-03 ENCOUNTER — Other Ambulatory Visit: Payer: Self-pay | Admitting: Internal Medicine

## 2024-06-03 DIAGNOSIS — Z1231 Encounter for screening mammogram for malignant neoplasm of breast: Secondary | ICD-10-CM

## 2024-06-05 ENCOUNTER — Inpatient Hospital Stay: Admission: RE | Admit: 2024-06-05 | Source: Ambulatory Visit
